# Patient Record
Sex: Female | Born: 1965 | Race: White | Hispanic: No | Smoking: Current every day smoker
Health system: Southern US, Community
[De-identification: ages and names within clinical notes are randomized; demographics above are authoritative.]

## PROBLEM LIST (undated history)

## (undated) DIAGNOSIS — E119 Type 2 diabetes mellitus without complications: Secondary | ICD-10-CM

## (undated) DIAGNOSIS — G43909 Migraine, unspecified, not intractable, without status migrainosus: Secondary | ICD-10-CM

## (undated) DIAGNOSIS — E785 Hyperlipidemia, unspecified: Secondary | ICD-10-CM

## (undated) DIAGNOSIS — I10 Essential (primary) hypertension: Secondary | ICD-10-CM

## (undated) DIAGNOSIS — J189 Pneumonia, unspecified organism: Secondary | ICD-10-CM

## (undated) HISTORY — PX: TUBAL LIGATION: SHX77

## (undated) HISTORY — PX: ABLATION: SHX5711

## (undated) HISTORY — DX: Hyperlipidemia, unspecified: E78.5

## (undated) HISTORY — DX: Migraine, unspecified, not intractable, without status migrainosus: G43.909

## (undated) HISTORY — DX: Type 2 diabetes mellitus without complications: E11.9

## (undated) HISTORY — DX: Essential (primary) hypertension: I10

---

## 2005-02-18 ENCOUNTER — Encounter: Admission: RE | Admit: 2005-02-18 | Discharge: 2005-02-18 | Payer: Self-pay | Admitting: *Deleted

## 2006-07-28 ENCOUNTER — Other Ambulatory Visit: Admission: RE | Admit: 2006-07-28 | Discharge: 2006-07-28 | Payer: Self-pay | Admitting: Family Medicine

## 2007-01-18 ENCOUNTER — Encounter: Admission: RE | Admit: 2007-01-18 | Discharge: 2007-01-18 | Payer: Self-pay | Admitting: Gastroenterology

## 2007-03-07 ENCOUNTER — Ambulatory Visit (HOSPITAL_COMMUNITY): Admission: RE | Admit: 2007-03-07 | Discharge: 2007-03-07 | Payer: Self-pay | Admitting: Obstetrics and Gynecology

## 2007-09-07 ENCOUNTER — Other Ambulatory Visit: Admission: RE | Admit: 2007-09-07 | Discharge: 2007-09-07 | Payer: Self-pay | Admitting: Obstetrics and Gynecology

## 2007-10-11 ENCOUNTER — Encounter: Admission: RE | Admit: 2007-10-11 | Discharge: 2007-10-11 | Payer: Self-pay | Admitting: Family Medicine

## 2008-04-23 ENCOUNTER — Encounter: Admission: RE | Admit: 2008-04-23 | Discharge: 2008-04-23 | Payer: Self-pay | Admitting: Family Medicine

## 2008-08-17 ENCOUNTER — Emergency Department (HOSPITAL_COMMUNITY): Admission: EM | Admit: 2008-08-17 | Discharge: 2008-08-17 | Payer: Self-pay | Admitting: Emergency Medicine

## 2009-11-13 ENCOUNTER — Other Ambulatory Visit: Admission: RE | Admit: 2009-11-13 | Discharge: 2009-11-13 | Payer: Self-pay | Admitting: Family Medicine

## 2010-04-25 ENCOUNTER — Emergency Department (HOSPITAL_COMMUNITY)
Admission: EM | Admit: 2010-04-25 | Discharge: 2010-04-25 | Payer: Self-pay | Source: Home / Self Care | Admitting: Emergency Medicine

## 2010-07-04 ENCOUNTER — Encounter: Payer: Self-pay | Admitting: Family Medicine

## 2010-08-24 LAB — BASIC METABOLIC PANEL
CO2: 27 mEq/L (ref 19–32)
Calcium: 9.1 mg/dL (ref 8.4–10.5)
Creatinine, Ser: 0.76 mg/dL (ref 0.4–1.2)
GFR calc Af Amer: >60 mL/min (ref >60)
GFR calc non Af Amer: >60 mL/min (ref >60)
Sodium: 142 mEq/L (ref 135–145)

## 2010-08-24 LAB — DIFFERENTIAL
Basophils Relative: 1 % (ref 0–1)
Eosinophils Absolute: 0.1 10*3/uL (ref 0.0–0.7)
Lymphs Abs: 1.6 10*3/uL (ref 0.7–4.0)
Neutro Abs: 5.8 10*3/uL (ref 1.7–7.7)
Neutrophils Relative %: 74 % (ref 43–77)

## 2010-08-24 LAB — CBC
MCH: 32.8 pg (ref 26.0–34.0)
Platelets: 294 10*3/uL (ref 150–400)
RBC: 4.35 MIL/uL (ref 3.87–5.11)

## 2010-08-24 LAB — URINALYSIS, ROUTINE W REFLEX MICROSCOPIC
Hgb urine dipstick: NEGATIVE
Nitrite: NEGATIVE
Protein, ur: NEGATIVE mg/dL
Specific Gravity, Urine: 1.011 (ref 1.005–1.030)
Urobilinogen, UA: 0.2 mg/dL (ref 0.0–1.0)

## 2010-08-24 LAB — D-DIMER, QUANTITATIVE: D-Dimer, Quant: <0.22 ug/mL-FEU (ref 0.00–0.48)

## 2010-08-24 LAB — POCT PREGNANCY, URINE: Preg Test, Ur: NEGATIVE

## 2010-08-24 LAB — POCT CARDIAC MARKERS

## 2010-09-23 ENCOUNTER — Other Ambulatory Visit: Payer: Self-pay | Admitting: Family Medicine

## 2010-09-23 DIAGNOSIS — R921 Mammographic calcification found on diagnostic imaging of breast: Secondary | ICD-10-CM

## 2010-09-23 LAB — URINALYSIS, ROUTINE W REFLEX MICROSCOPIC
Bilirubin Urine: NEGATIVE
Hgb urine dipstick: NEGATIVE
Specific Gravity, Urine: 1.025 (ref 1.005–1.030)
pH: 6 (ref 5.0–8.0)

## 2010-09-23 LAB — DIFFERENTIAL
Basophils Absolute: 0 10*3/uL (ref 0.0–0.1)
Eosinophils Relative: 1 % (ref 0–5)
Lymphocytes Relative: 22 % (ref 12–46)
Neutro Abs: 3.2 10*3/uL (ref 1.7–7.7)

## 2010-09-23 LAB — COMPREHENSIVE METABOLIC PANEL
AST: 21 U/L (ref 0–37)
BUN: 4 mg/dL — ABNORMAL LOW (ref 6–23)
CO2: 22 mEq/L (ref 19–32)
Chloride: 109 mEq/L (ref 96–112)
Creatinine, Ser: 0.74 mg/dL (ref 0.4–1.2)
GFR calc non Af Amer: >60 mL/min (ref >60)
Glucose, Bld: 140 mg/dL — ABNORMAL HIGH (ref 70–99)
Total Bilirubin: 0.3 mg/dL (ref 0.3–1.2)

## 2010-09-23 LAB — CBC
HCT: 44.3 % (ref 36.0–46.0)
Hemoglobin: 15.4 g/dL — ABNORMAL HIGH (ref 12.0–15.0)
MCV: 92.5 fL (ref 78.0–100.0)
RBC: 4.8 MIL/uL (ref 3.87–5.11)
WBC: 4.6 10*3/uL (ref 4.0–10.5)

## 2010-09-23 LAB — LIPASE, BLOOD: Lipase: 27 U/L (ref 11–59)

## 2010-09-28 ENCOUNTER — Ambulatory Visit
Admission: RE | Admit: 2010-09-28 | Discharge: 2010-09-28 | Disposition: A | Discharge status: Home / Self Care | Payer: BC Managed Care – PPO | Source: Ambulatory Visit | Attending: Family Medicine | Admitting: Family Medicine

## 2010-09-28 DIAGNOSIS — R921 Mammographic calcification found on diagnostic imaging of breast: Secondary | ICD-10-CM

## 2010-10-26 NOTE — H&P (Signed)
NAMEJANISE, [name] NO.:  [id number]   MEDICAL RECORD NO.:  [id number]          PATIENT TYPE:  AMB   LOCATION:  SDC                           FACILITY:  [facility]   PHYSICIAN:  Gerald Leitz, MD          DATE OF BIRTH:  [birth year]   DATE OF ADMISSION:  03/07/2007  DATE OF DISCHARGE:                              HISTORY & PHYSICAL   HISTORY OF PRESENT ILLNESS:  This is a 45 year old, G3, P3, with  menorrhagia who desires therapy with endometrial ablation.  She also  desires permanent sterilization.   PAST MEDICAL HISTORY:  1. Depression.  2. Questionable mild Crohn's disease.   PAST OB HISTORY:  Spontaneous vaginal delivery times 3.   PAST GYN HISTORY:  1. 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  2. Endometrial biopsy performed January 17, 2007.  She had proliferative      endometrium.  No evidence of hyperplasia or malignancy.   PAST SURGICAL HISTORY:  1. Laparoscopy in 1996 for pelvic pain and dyspareunia which was done      in [place], [place].  2. Tonsillectomy and adenoidectomy in 1985.   MEDICATIONS:  Effexor 75 mg XR 1 p.o. daily.   ALLERGIES:  No known drug allergies.   SOCIAL HISTORY:  The patient is married.  She quit smoking 8-1/2 years  ago.  She denies alcohol use and no illicit drug use.   FAMILY HISTORY:  Maternal grandmother with breast cancer.  Maternal aunt  with ovarian cancer.  No history of colon cancer.   REVIEW OF SYSTEMS:  Negative except as stated in the history of present  illness.   PHYSICAL EXAMINATION:  VITAL SIGNS:  Blood pressure 108/72, weight 195  pounds.  CARDIOVASCULAR:  Regular rate and rhythm.  LUNGS:  Clear to auscultation bilaterally.  ABDOMEN:  Soft, nontender, nondistended, positive bowel sounds.  EXTREMITIES:  No clubbing, cyanosis or edema.  PELVIC:  Normal external female genitalia, no vaginal or cervical  lesions noted.  Normal sized uterus.  No adnexal masses or tenderness.  Ultrasound performed January 15, 2007 shows the uterus to measure 9.8 cm  in length, 4.8 cm in AP diameter and 5.9 cm in width.  Right and left  ovaries appeared normal.  The left ovary had a simple cyst 3.7 x 3.4 cm,  which was avascular.   IMPRESSION:  1. Menorrhagia.  2. Desires permanent sterilization.   PLAN:  The patient desires laparoscopic bilateral tubal ligation and  hysteroscopy and endometrial ablation.  Risks, benefits, and  alternatives of surgery were discussed with the patient including but  not limited to infection, bleeding, damage to bowel, bladder or  surrounding organs with need for further surgery, as well as the risk of  uterine perforation with need for further surgery.  The patient voiced  understanding of risks.  Discussed with the patient risk of transfusion,  HIV, hepatitis B and C.  The patient understands risks and desires to  proceed with laparoscopic bilateral tubal ligation and hysteroscopy with  endometrial ablation.  Gerald Leitz, MD  Electronically Signed     TC/MEDQ  D:  03/05/2007  T:  03/06/2007  Job:  [id number]

## 2010-10-26 NOTE — Op Note (Signed)
[name], [name] NO.:  [id number]   MEDICAL RECORD NO.:  [id number]          PATIENT TYPE:  AMB   LOCATION:  SDC                           [facility]ACILITY:  [facility]   PHYSICIAN:  Gerald Leitz, MD          DATE OF BIRTH:  04-22-1966   DATE OF PROCEDURE:  03/07/2007  DATE OF DISCHARGE:                               OPERATIVE REPORT   PREOPERATIVE DIAGNOSIS:  1. Menorrhagia.  2. Desires permanent sterilization.   POSTOPERATIVE DIAGNOSIS:  1. Menorrhagia.  2. Desires permanent sterilization.   PROCEDURE:  Diagnostic hysteroscopy with NovaSure endometrial ablation  and laparoscopic bilateral tubal ligation with Filshie clip   SURGEON:  Gerald Leitz, M.D.   ASSISTANT:  None.   ANESTHESIA:  General.   FINDINGS:  Normal appearing endometrium.  Normal uterus, fallopian tubes  and ovaries.   SPECIMEN:  None.   ESTIMATED BLOOD LOSS:  Minimal.   FLUIDS:  1600 mL of LR.   URINE OUTPUT:  300 mL of clear urine at the end of the procedure.   COMPLICATIONS:  None.   INDICATIONS:  This is a 45 year old with menorrhagia who failed medical  management and desired therapy with endometrial ablation.  She also  desires permanent sterilization.   PROCEDURE IN DETAIL:  Informed consent was obtained. The patient was  taken to the operating room where she was placed under general  anesthesia. She was placed in the dorsal lithotomy position.  She was  then prepped and draped in the usual sterile fashion.  A bivalve  speculum was placed into the vaginal vault and the anterior lip of the  cervix grasped with a single tooth tenaculum.  The uterus was sounded to  8.5 cm.  The endocervical length was found to be 2.5 cm. The  hysteroscope was inserted and the cavity was found to be normal.  The  hysteroscope was removed and the cervix dilated to 8 mm.  The NovaSure  ablation apparatus was inserted until the fundus was reached.  The array  was deployed. CO2 cavity integrity test was  performed and found to be  normal. Ablation proceeded for a total of 75 seconds. The NovaSure  apparatus was removed.  The hysteroscope was then reinserted.  No  evidence of perforation. Endometrium seemed to be appropriately  desiccated.  The single tooth tenaculum was removed from the cervix and  the uterine manipulator was then placed. Sterile gloves were changed.   Attention was turned to the abdomen where a 5 mm skin incision was made  at the umbilicus and the 5 mm Excel trocar was placed under direct  visualization. Pneumoperitoneum was achieved with CO2 gas.  Opening  pressure was 8 mm. Once pneumoperitoneum was achieved, the pelvis was  examined with the findings noted above. A 1 cm incision was made 2 cm  above the pubic symphysis and a 10 mm trocar was placed under direct  visualization.  Filshie clips were then placed on the left and right  fallopian tubes.  The clip apparatus was removed.  CO2 gas was released.  The 10 mm trocar  was removed followed by the 5 mm trocar under direct  visualization.  The fascia at the 10 mm trocar site was reapproximated  with 0 Vicryl.  The skin was closed with 3-0 Vicryl.  The skin of the  umbilicus was closed with 4-0 Vicryl.  The uterine manipulator was  removed from the cervix.  There was found to be some bleeding at the  manipulator site and hemostasis was obtained with silver nitrate.  The  patient was then awakened from anesthesia.  She was taken to the  recovery room awake and in stable condition.      Gerald Leitz, MD  Electronically Signed     TC/MEDQ  D:  03/07/2007  T:  03/07/2007  Job:  [id number]

## 2010-12-03 ENCOUNTER — Other Ambulatory Visit: Payer: Self-pay | Admitting: Family Medicine

## 2010-12-03 ENCOUNTER — Ambulatory Visit
Admission: RE | Admit: 2010-12-03 | Discharge: 2010-12-03 | Disposition: A | Discharge status: Home / Self Care | Payer: BC Managed Care – PPO | Source: Ambulatory Visit | Attending: Family Medicine | Admitting: Family Medicine

## 2010-12-03 ENCOUNTER — Other Ambulatory Visit (HOSPITAL_COMMUNITY)
Admission: RE | Admit: 2010-12-03 | Discharge: 2010-12-03 | Disposition: A | Discharge status: Home / Self Care | Payer: BC Managed Care – PPO | Source: Ambulatory Visit | Attending: Family Medicine | Admitting: Family Medicine

## 2010-12-03 DIAGNOSIS — R062 Wheezing: Secondary | ICD-10-CM

## 2010-12-03 DIAGNOSIS — R05 Cough: Secondary | ICD-10-CM

## 2010-12-03 DIAGNOSIS — Z124 Encounter for screening for malignant neoplasm of cervix: Secondary | ICD-10-CM | POA: Insufficient documentation

## 2011-03-24 LAB — CBC
Hemoglobin: 14
MCHC: 34.6
MCV: 89.7
RBC: 4.52
WBC: 5.4

## 2011-03-24 LAB — TYPE AND SCREEN
ABO/RH(D): O POS
Antibody Screen: NEGATIVE

## 2011-03-24 LAB — BASIC METABOLIC PANEL
CO2: 28
Chloride: 105
Creatinine, Ser: 0.66
GFR calc Af Amer: >60
Sodium: 137

## 2011-03-24 LAB — PREGNANCY, URINE: Preg Test, Ur: NEGATIVE

## 2011-03-24 LAB — ABO/RH: ABO/RH(D): O POS

## 2011-12-19 ENCOUNTER — Emergency Department (HOSPITAL_COMMUNITY)
Admission: EM | Admit: 2011-12-19 | Discharge: 2011-12-19 | Disposition: A | Discharge status: Home / Self Care | Payer: 59 | Attending: Emergency Medicine | Admitting: Emergency Medicine

## 2011-12-19 ENCOUNTER — Encounter (HOSPITAL_COMMUNITY): Payer: Self-pay | Admitting: Emergency Medicine

## 2011-12-19 ENCOUNTER — Emergency Department (HOSPITAL_COMMUNITY): Payer: 59

## 2011-12-19 DIAGNOSIS — R079 Chest pain, unspecified: Secondary | ICD-10-CM

## 2011-12-19 DIAGNOSIS — R0602 Shortness of breath: Secondary | ICD-10-CM | POA: Insufficient documentation

## 2011-12-19 DIAGNOSIS — F17200 Nicotine dependence, unspecified, uncomplicated: Secondary | ICD-10-CM | POA: Insufficient documentation

## 2011-12-19 LAB — BASIC METABOLIC PANEL
Chloride: 102 mEq/L (ref 96–112)
GFR calc Af Amer: >90 mL/min (ref >90)
GFR calc non Af Amer: >90 mL/min (ref >90)
Potassium: 4.2 mEq/L (ref 3.5–5.1)
Sodium: 137 mEq/L (ref 135–145)

## 2011-12-19 LAB — TROPONIN I: Troponin I: <0.3 ng/mL (ref <0.30)

## 2011-12-19 LAB — CBC
HCT: 42.1 % (ref 36.0–46.0)
MCHC: 34.9 g/dL (ref 30.0–36.0)
RDW: 12.7 % (ref 11.5–15.5)
WBC: 9 10*3/uL (ref 4.0–10.5)

## 2011-12-19 LAB — D-DIMER, QUANTITATIVE: D-Dimer, Quant: <0.22 ug/mL-FEU (ref 0.00–0.48)

## 2011-12-19 MED ORDER — IBUPROFEN 200 MG PO TABS
600.0000 mg | ORAL_TABLET | Freq: Once | ORAL | Status: AC
  Administered 2011-12-19: 600 mg via ORAL
  Filled 2011-12-19: qty 3

## 2011-12-19 MED ORDER — OMEPRAZOLE 20 MG PO CPDR: 20.0000 mg | DELAYED_RELEASE_CAPSULE | Freq: Every day | ORAL | Status: DC

## 2011-12-19 MED ORDER — GI COCKTAIL ~~LOC~~
30.0000 mL | Freq: Once | ORAL | Status: AC
  Administered 2011-12-19: 30 mL via ORAL
  Filled 2011-12-19: qty 30

## 2011-12-19 MED ORDER — IBUPROFEN 600 MG PO TABS: 600.0000 mg | ORAL_TABLET | Freq: Three times a day (TID) | ORAL | Status: AC | PRN

## 2011-12-19 NOTE — ED Notes (Signed)
Pt states that she began having chest pain, dizziness, tingling, diaphoresis, and SOB. Laid down for awhile and pain resolved. Then got back up and pain started again. Laid down again and pain improved again. Pt has equal, unlabored respirations and is in nad.

## 2011-12-19 NOTE — ED Notes (Signed)
Pt alert, nad, c/o sharp non radiating left sided chest pain, onset was this evening, pt c/o sob, resp even unlabored, skin pwd, states pain comes and goes, denies n/v

## 2011-12-19 NOTE — ED Provider Notes (Signed)
History     CSN: [id number]  Arrival date & time 12/19/11  0358   First MD Initiated Contact with Patient 12/19/11 [phone]      Chief Complaint  Patient presents with  . Chest Pain     The history is provided by the patient.   the patient reports developing sharp left-sided chest pain this evening.  She had mild shortness of breath.  She reported the pain would come for several minutes and then would resolve.  There is no radiation of her pain.  She had no diaphoresis.  She denies nausea and vomiting.  She does smoke cigarettes but otherwise has no cardiac risk factors.  She is no prior history of DVT or pulmonary embolism.  She denies fevers and chills.  She's had no productive cough.  She denies recent lung travel or surgery.  She's had no unilateral leg swelling.  She denies abdominal pain.  She has no other complaints at this time.  Nothing worsens her symptoms except for deep breathing.  Nothing improves her symptoms.  Her symptoms are mild to moderate in severity  Past Medical History  Diagnosis Date  . Crohn disease     Past Surgical History  Procedure Date  . Tubal ligation     No family history on file.  History  Substance Use Topics  . Smoking status: Current Everyday Smoker -- 1.0 packs/day    Types: Cigarettes  . Smokeless tobacco: Not on file  . Alcohol Use: No    OB History    Grav Para Term Preterm Abortions TAB SAB Ect Mult Living                  Review of Systems  All other systems reviewed and are negative.    Allergies  Klonopin  Home Medications   Current Outpatient Rx  Name Route Sig Dispense Refill  . ALIGN 4 MG PO CAPS Oral Take 1 tablet by mouth daily.    . TOPIRAMATE 25 MG PO CPSP Oral Take 75 mg by mouth daily.    . VENLAFAXINE HCL ER 150 MG PO CP24 Oral Take 150 mg by mouth daily.    . IBUPROFEN 600 MG PO TABS Oral Take 1 tablet (600 mg total) by mouth every 8 (eight) hours as needed for pain. 15 tablet 0  . OMEPRAZOLE 20 MG PO CPDR  Oral Take 1 capsule (20 mg total) by mouth daily. 30 capsule 0    BP 113/77  Pulse 68  Temp 98 F (36.7 C) (Oral)  Resp 21  Wt 185 lb (83.915 kg)  SpO2 97%  Physical Exam  Nursing note and vitals reviewed. Constitutional: She is oriented to person, place, and time. She appears well-developed and well-nourished. No distress.  HENT:  Head: Normocephalic and atraumatic.  Eyes: EOM are normal.  Neck: Normal range of motion.  Cardiovascular: Normal rate, regular rhythm and normal heart sounds.   Pulmonary/Chest: Effort normal and breath sounds normal.       Mild tenderness of left anterior chest wall  Abdominal: Soft. She exhibits no distension. There is no tenderness.  Musculoskeletal: Normal range of motion.  Neurological: She is alert and oriented to person, place, and time.  Skin: Skin is warm and dry.  Psychiatric: She has a normal mood and affect. Judgment normal.    ED Course  Procedures (including critical care time)  Date: 12/19/2011  Rate: 94  Rhythm: normal sinus rhythm  QRS Axis: normal  Intervals: normal  ST/T Wave abnormalities: normal  Conduction Disutrbances: none  Narrative Interpretation:   Old EKG Reviewed: No significant changes noted     Labs Reviewed  BASIC METABOLIC PANEL - Abnormal; Notable for the following:    Glucose, Bld 108 (*)     All other components within normal limits  CBC  TROPONIN I  D-DIMER, QUANTITATIVE   Dg Chest 2 View  12/19/2011  *RADIOLOGY REPORT*  Clinical Data: Left-sided chest pain and shortness of breath; history of smoking.  CHEST - 2 VIEW  Comparison: Chest radiograph performed 12/03/2010  Findings: The lungs are well-aerated.  Slight medial right basilar opacity may reflect atelectasis or possibly minimal pneumonia. There is no evidence of pleural effusion or pneumothorax.  The heart is normal in size; the mediastinal contour is within normal limits.  No acute osseous abnormalities are seen.  IMPRESSION: Slight medial  right basilar airspace opacity may reflect atelectasis or possibly minimal pneumonia.  Original Report Authenticated By: Tonia Ghent, M.D.    I personally reviewed the imaging tests through PACS system  I reviewed available ER/hospitalization records thought the EMR   1. Chest pain       MDM    The patient is well-appearing.  I suspect the majority of her pain is pleurisy.  I do not believe that she has right-sided pneumonia.  Her symptoms are left-sided.  Ibuprofen has helped her pain.  Her EKG is normal her enzymes are normal.  Her d-dimer is normal.  Discharge home in good condition.      Lyanne Co, MD 12/19/11 [phone]

## 2013-04-01 ENCOUNTER — Emergency Department (HOSPITAL_COMMUNITY)
Admission: EM | Admit: 2013-04-01 | Discharge: 2013-04-01 | Disposition: A | Discharge status: Home / Self Care | Payer: PRIVATE HEALTH INSURANCE | Attending: Emergency Medicine | Admitting: Emergency Medicine

## 2013-04-01 ENCOUNTER — Encounter (HOSPITAL_COMMUNITY): Payer: Self-pay | Admitting: Emergency Medicine

## 2013-04-01 DIAGNOSIS — F17200 Nicotine dependence, unspecified, uncomplicated: Secondary | ICD-10-CM | POA: Insufficient documentation

## 2013-04-01 DIAGNOSIS — Z79899 Other long term (current) drug therapy: Secondary | ICD-10-CM | POA: Insufficient documentation

## 2013-04-01 DIAGNOSIS — R319 Hematuria, unspecified: Secondary | ICD-10-CM | POA: Insufficient documentation

## 2013-04-01 DIAGNOSIS — Z792 Long term (current) use of antibiotics: Secondary | ICD-10-CM | POA: Insufficient documentation

## 2013-04-01 DIAGNOSIS — N390 Urinary tract infection, site not specified: Secondary | ICD-10-CM | POA: Insufficient documentation

## 2013-04-01 LAB — URINALYSIS, ROUTINE W REFLEX MICROSCOPIC
Ketones, ur: NEGATIVE mg/dL
Nitrite: POSITIVE — AB
Protein, ur: >300 mg/dL — AB
Urobilinogen, UA: 1 mg/dL (ref 0.0–1.0)

## 2013-04-01 LAB — URINE MICROSCOPIC-ADD ON

## 2013-04-01 MED ORDER — SULFAMETHOXAZOLE-TMP DS 800-160 MG PO TABS: 1.0000 | ORAL_TABLET | Freq: Two times a day (BID) | ORAL | Status: DC

## 2013-04-01 MED ORDER — PHENAZOPYRIDINE HCL 200 MG PO TABS: 200.0000 mg | ORAL_TABLET | Freq: Three times a day (TID) | ORAL | Status: DC

## 2013-04-01 MED ORDER — SULFAMETHOXAZOLE-TMP DS 800-160 MG PO TABS
1.0000 | ORAL_TABLET | Freq: Once | ORAL | Status: AC
  Administered 2013-04-01: 1 via ORAL
  Filled 2013-04-01: qty 1

## 2013-04-01 MED ORDER — PHENAZOPYRIDINE HCL 200 MG PO TABS
200.0000 mg | ORAL_TABLET | Freq: Three times a day (TID) | ORAL | Status: DC
  Administered 2013-04-01: 200 mg via ORAL
  Filled 2013-04-01 (×2): qty 1

## 2013-04-01 NOTE — ED Provider Notes (Signed)
Medical screening examination/treatment/procedure(s) were performed by non-physician practitioner and as supervising physician I was immediately available for consultation/collaboration.   Kura Bethards, MD 04/01/13 0608 

## 2013-04-01 NOTE — ED Notes (Signed)
Urinary frequency with some blood noted

## 2013-04-01 NOTE — ED Provider Notes (Signed)
CSN: [id number]     Arrival date & time 04/01/13  0154 History   First MD Initiated Contact with Patient 04/01/13 0208     Chief Complaint  Patient presents with  . Urinary Frequency   (Consider location/radiation/quality/duration/timing/severity/associated sxs/prior Treatment) Patient is a 47 y.o. female presenting with frequency. The history is provided by the patient. No language interpreter was used.  Urinary Frequency This is a new problem. The current episode started yesterday. The problem occurs constantly. Pertinent negatives include no abdominal pain, fever or vomiting. Associated symptoms comments: Symptoms of urinary frequency, painful urination, hematuria. No fever, nausea or vomiting. "It feels like I have a urinary tract infection". No vaginal discharge. Marland Kitchen    History reviewed. No pertinent past medical history. Past Surgical History  Procedure Laterality Date  . Tubal ligation    . Ablation     No family history on file. History  Substance Use Topics  . Smoking status: Current Every Day Smoker -- 1.00 packs/day    Types: Cigarettes  . Smokeless tobacco: Not on file  . Alcohol Use: Yes     Comment: socially   OB History   Grav Para Term Preterm Abortions TAB SAB Ect Mult Living                 Review of Systems  Constitutional: Negative for fever.  Gastrointestinal: Negative for vomiting and abdominal pain.  Genitourinary: Positive for urgency, frequency and hematuria. Negative for flank pain and vaginal discharge.    Allergies  Klonopin and Shellfish allergy  Home Medications   Current Outpatient Rx  Name  Route  Sig  Dispense  Refill  . naproxen sodium (ANAPROX) 220 MG tablet   Oral   Take 220 mg by mouth 2 (two) times daily as needed (pain).         . topiramate (TOPAMAX) 25 MG capsule   Oral   Take 75 mg by mouth every evening.          . venlafaxine XR (EFFEXOR-XR) 150 MG 24 hr capsule   Oral   Take 150 mg by mouth every morning.            BP 164/90  Pulse 97  Temp(Src) 98.7 F (37.1 C) (Oral)  Resp 16  Ht 5\' 3"  (1.6 m)  Wt 147 lb (66.679 kg)  BMI 26.05 kg/m2  SpO2 97% Physical Exam  Constitutional: She is oriented to person, place, and time. She appears well-developed and well-nourished.  Abdominal: Soft. She exhibits no distension.  Mild suprapubic tenderness.   Neurological: She is alert and oriented to person, place, and time.  Skin: Skin is warm and dry.  Psychiatric: She has a normal mood and affect.    ED Course  Procedures (including critical care time) Labs Review Labs Reviewed  URINALYSIS, ROUTINE W REFLEX MICROSCOPIC - Abnormal; Notable for the following:    APPearance TURBID (*)    Specific Gravity, Urine 1.031 (*)    Hgb urine dipstick LARGE (*)    Bilirubin Urine SMALL (*)    Protein, ur >300 (*)    Nitrite POSITIVE (*)    Leukocytes, UA LARGE (*)    All other components within normal limits  URINE MICROSCOPIC-ADD ON - Abnormal; Notable for the following:    Squamous Epithelial / LPF FEW (*)    Bacteria, UA MANY (*)    All other components within normal limits  URINE CULTURE   Imaging Review No results found.  EKG Interpretation  None       MDM  No diagnosis found. 1. UTI  Uncomplicated UTI. Abx and pyridium given. Stable for discharge.      Arnoldo Hooker, PA-C 04/01/13 [phone]

## 2013-04-02 LAB — URINE CULTURE: Colony Count: 70000

## 2013-06-29 ENCOUNTER — Emergency Department (HOSPITAL_COMMUNITY)
Admission: EM | Admit: 2013-06-29 | Discharge: 2013-06-29 | Disposition: A | Discharge status: Home / Self Care | Payer: Self-pay | Attending: Emergency Medicine | Admitting: Emergency Medicine

## 2013-06-29 ENCOUNTER — Emergency Department (HOSPITAL_COMMUNITY): Payer: Self-pay

## 2013-06-29 ENCOUNTER — Encounter (HOSPITAL_COMMUNITY): Payer: Self-pay | Admitting: Emergency Medicine

## 2013-06-29 DIAGNOSIS — R6889 Other general symptoms and signs: Secondary | ICD-10-CM

## 2013-06-29 DIAGNOSIS — J189 Pneumonia, unspecified organism: Secondary | ICD-10-CM | POA: Insufficient documentation

## 2013-06-29 DIAGNOSIS — M545 Low back pain, unspecified: Secondary | ICD-10-CM | POA: Insufficient documentation

## 2013-06-29 DIAGNOSIS — F17200 Nicotine dependence, unspecified, uncomplicated: Secondary | ICD-10-CM | POA: Insufficient documentation

## 2013-06-29 DIAGNOSIS — J441 Chronic obstructive pulmonary disease with (acute) exacerbation: Secondary | ICD-10-CM | POA: Insufficient documentation

## 2013-06-29 DIAGNOSIS — J111 Influenza due to unidentified influenza virus with other respiratory manifestations: Secondary | ICD-10-CM | POA: Insufficient documentation

## 2013-06-29 DIAGNOSIS — Z8701 Personal history of pneumonia (recurrent): Secondary | ICD-10-CM | POA: Insufficient documentation

## 2013-06-29 DIAGNOSIS — Z79899 Other long term (current) drug therapy: Secondary | ICD-10-CM | POA: Insufficient documentation

## 2013-06-29 DIAGNOSIS — J449 Chronic obstructive pulmonary disease, unspecified: Secondary | ICD-10-CM

## 2013-06-29 HISTORY — DX: Pneumonia, unspecified organism: J18.9

## 2013-06-29 MED ORDER — HYDROCODONE-ACETAMINOPHEN 7.5-325 MG/15ML PO SOLN: 15.0000 mL | Freq: Four times a day (QID) | ORAL | Status: AC | PRN

## 2013-06-29 NOTE — ED Provider Notes (Signed)
CSN: [id number]     Arrival date & time 06/29/13  1324 History  This chart was scribed for non-physician practitioner, Fayrene Helper, PA-C working with Audree Camel, MD by Greggory Stallion, ED scribe. This patient was seen in room WTR5/WTR5 and the patient's care was started at 3:22 PM.   Chief Complaint  Patient presents with  . Shortness of Breath   Patient is a 48 y.o. female presenting with shortness of breath. The history is provided by the patient. No language interpreter was used.  Shortness of Breath Severity:  Moderate Onset quality:  Gradual Duration:  3 weeks Timing:  Intermittent Progression:  Unchanged Chronicity:  New Ineffective treatments:  Inhaler (antibiotics, cough medication) Associated symptoms: cough and wheezing   Associated symptoms: no chest pain    HPI Comments: [name] is a 48 y.o. female who presents to the Emergency Department complaining of intermittent shortness of breath with associated cough and wheezing that started 3-4 weeks ago. She states she had dizziness this morning but denies it currently. Pt states she was recently diagnosed with bilateral pneumonia by her PCP. A chest xray was not done. She states she has taken two Z-packs, cough medication and an inhaler with no relief. Pt states she has started having lower back pain that feels like a bruise. She has had intermittent chest pain but denies it currently. Pt smokes one pack of cigarettes per day. Denies history of heart problems or asthma. She states her PCP discussed the possibility of COPD with her but she has never been diagnosed.   Past Medical History  Diagnosis Date  . Pneumonia    Past Surgical History  Procedure Laterality Date  . Tubal ligation    . Ablation     History reviewed. No pertinent family history. History  Substance Use Topics  . Smoking status: Current Every Day Smoker -- 1.00 packs/day    Types: Cigarettes  . Smokeless tobacco: Not on file  . Alcohol Use: Yes      Comment: socially   OB History   Grav Para Term Preterm Abortions TAB SAB Ect Mult Living                 Review of Systems  Respiratory: Positive for cough, shortness of breath and wheezing.   Cardiovascular: Negative for chest pain.  Musculoskeletal: Positive for back pain.  Neurological: Negative for dizziness.  All other systems reviewed and are negative.   Allergies  Klonopin and Shellfish allergy  Home Medications   Current Outpatient Rx  Name  Route  Sig  Dispense  Refill  . naproxen sodium (ANAPROX) 220 MG tablet   Oral   Take 220 mg by mouth 2 (two) times daily as needed (pain).         . phenazopyridine (PYRIDIUM) 200 MG tablet   Oral   Take 1 tablet (200 mg total) by mouth 3 (three) times daily with meals.   10 tablet   0   . sulfamethoxazole-trimethoprim (BACTRIM DS) 800-160 MG per tablet   Oral   Take 1 tablet by mouth 2 (two) times daily.   10 tablet   0   . topiramate (TOPAMAX) 25 MG capsule   Oral   Take 75 mg by mouth every evening.          . venlafaxine XR (EFFEXOR-XR) 150 MG 24 hr capsule   Oral   Take 150 mg by mouth every morning.  BP 140/84  Pulse 79  Temp(Src) 98.4 F (36.9 C) (Oral)  Resp 18  SpO2 99%  Physical Exam  Nursing note and vitals reviewed. Constitutional: She is oriented to person, place, and time. She appears well-developed and well-nourished. No distress.  HENT:  Head: Normocephalic and atraumatic.  Eyes: EOM are normal.  Neck: Neck supple. No tracheal deviation present.  Cardiovascular: Normal rate, regular rhythm and normal heart sounds.   Pulmonary/Chest: Effort normal and breath sounds normal. No respiratory distress. She has no wheezes. She has no rales.  Musculoskeletal: Normal range of motion.  No overlying skin changes.   Neurological: She is alert and oriented to person, place, and time.  Skin: Skin is warm and dry.  Psychiatric: She has a normal mood and affect. Her behavior is  normal.   ED Course  Procedures (including critical care time)  DIAGNOSTIC STUDIES: Oxygen Saturation is 99% on RA, normal by my interpretation.    COORDINATION OF CARE: 3:38 PM-Discussed xray results and treatment plan which includes symptomatic treatment for a viral infection with pt at bedside and pt agreed to plan. Advised pt to quit smoking. Advised pt that  an antibiotic would not be useful with her viral infection. Advised pt to follow up with her PCP or return to the ED if her symptoms worsen.   Moderate time were spent discussing smoking cessation.    Labs Review Labs Reviewed - No data to display Imaging Review Dg Chest 2 View  06/29/2013   CLINICAL DATA:  Two week history of flank pain now with dyspnea and chest pain  EXAM: CHEST  2 VIEW  COMPARISON:  PA and lateral chest x-ray of December 29, 2011  FINDINGS: The lungs are mildly hyperinflated. There is no focal infiltrate. The interstitial markings are somewhat coarse diffusely. The cardiopericardial silhouette is normal in size. The pulmonary vascularity is not engorged. The mediastinum is normal in width. There is no pleural effusion. The observed portions of the bony thorax exhibit no acute abnormalities.  IMPRESSION: There is mild hyperinflation which may be voluntary or reflect underlying COPD or reactive airway disease. Slightly increased lung markings diffusely are not new. These may be related to the patient's smoking history. There is no evidence of pneumonia nor of a pleural effusion nor other acute cardiopulmonary abnormality.   Electronically Signed   By: David  [email or web address]   On: 06/29/2013 14:44    EKG Interpretation   None       MDM   1. Flu-like symptoms   2. COPD (chronic obstructive pulmonary disease)    BP 140/84  Pulse 79  Temp(Src) 98.4 F (36.9 C) (Oral)  Resp 18  SpO2 99%  I have reviewed nursing notes and vital signs. I personally reviewed the imaging tests through PACS system  I reviewed available  ER/hospitalization records thought the EMR  I personally performed the services described in this documentation, which was scribed in my presence. The recorded information has been reviewed and is accurate.    Fayrene HelperBowie Jaina Morin, PA-C 06/29/13 1550

## 2013-06-29 NOTE — Discharge Instructions (Signed)
Viral Infections A viral infection can be caused by different types of viruses.Most viral infections are not serious and resolve on their own. However, some infections may cause severe symptoms and may lead to further complications. SYMPTOMS Viruses can frequently cause:  Minor sore throat.  Aches and pains.  Headaches.  Runny nose.  Different types of rashes.  Watery eyes.  Tiredness.  Cough.  Loss of appetite.  Gastrointestinal infections, resulting in nausea, vomiting, and diarrhea. These symptoms do not respond to antibiotics because the infection is not caused by bacteria. However, you might catch a bacterial infection following the viral infection. This is sometimes called a "superinfection." Symptoms of such a bacterial infection may include:  Worsening sore throat with pus and difficulty swallowing.  Swollen neck glands.  Chills and a high or persistent fever.  Severe headache.  Tenderness over the sinuses.  Persistent overall ill feeling (malaise), muscle aches, and tiredness (fatigue).  Persistent cough.  Yellow, green, or brown mucus production with coughing. HOME CARE INSTRUCTIONS   Only take over-the-counter or prescription medicines for pain, discomfort, diarrhea, or fever as directed by your caregiver.  Drink enough water and fluids to keep your urine clear or pale yellow. Sports drinks can provide valuable electrolytes, sugars, and hydration.  Get plenty of rest and maintain proper nutrition. Soups and broths with crackers or rice are fine. SEEK IMMEDIATE MEDICAL CARE IF:   You have severe headaches, shortness of breath, chest pain, neck pain, or an unusual rash.  You have uncontrolled vomiting, diarrhea, or you are unable to keep down fluids.  You or your child has an oral temperature above 102 F (38.9 C), not controlled by medicine.  Your baby is older than 3 months with a rectal temperature of 102 F (38.9 C) or higher.  Your baby is 63  months old or younger with a rectal temperature of 100.4 F (38 C) or higher. MAKE SURE YOU:   Understand these instructions.  Will watch your condition.  Will get help right away if you are not doing well or get worse. Document Released: 03/09/2005 Document Revised: 08/22/2011 Document Reviewed: 10/04/2010 [facility] Patient Information 2014 [place], [place].   Chronic Obstructive Pulmonary Disease Chronic obstructive pulmonary disease (COPD) is a common lung problem. In COPD, the flow of air from the lungs is limited. The way your lungs work will probably never return to normal, but there are things you can do to improve you lungs and make yourself feel better. HOME CARE  Take all medicines as told by your doctor.  Only take over-the-counter or prescription medicines as told by your doctor.  Avoid medicines or cough syrups that dry up your airway (such as antihistamines) and do not allow you to get rid of thick spit. You do not need to avoid them if told differently by your doctor.  If you smoke, stop. Smoking makes the problem worse.  Avoid being around things that make your breathing worse (like smoke, chemicals, and fumes).  Use oxygen therapy and therapy to help improve your lungs (pulmonary rehabilitation) if told by your doctor. If you need home oxygen therapy, ask your doctor if you should buy a tool to measure your oxygen level (oximeter).  Avoid people who have a sickness you can catch (contagious).  Avoid going outside when it is very hot, cold, or humid.  Eat healthy foods. Eat smaller meals more often. Rest before meals.  Stay active, but remember to also rest.  Make sure to get all the  shots (vaccines) your doctor recommends. Ask your doctor if you need a pneumonia shot.  Learn and use tips on how to relax.  Learn and use tips on how to control your breathing as told by your doctor. Try:  Breathing in (inhaling) through your nose for 1 second. Then, pucker your  lips and breath out (exhale) through your lips for 2 seconds.  Putting one hand on your belly (abdomen). Breathe in slowly through your nose for 1 second. Your hand on your belly should move out. Pucker your lips and breathe out slowly through your lips. Your hand on your belly should move in as you breathe out.  Learn and use controlled coughing to clear thick spit from your lungs. 1. Lean your head a little forward. 2. Breathe in deeply. 3. Try to hold your breath for 3 seconds. 4. Keep your mouth slightly open while coughing 2 times. 5. Spit any thick spit out into a tissue. 6. Rest and do the steps again 1 or 2 times as needed. GET HELP IF:  You cough up more thick spit than usual.  There is a change in the color or thickness of the spit.  It is harder to breathe than usual.  Your breathing is faster than usual. GET HELP RIGHT AWAY IF:   You have shortness of breath while resting.  You have shortness of breath that stops you from:  Being able to talk.  Doing normal activities.  You chest hurts for longer than 5 minutes.  Your skin color is more blue than usual.  Your pulse oximeter shows that you have low oxygen for longer than 5 minutes. MAKE SURE YOU:   Understand these instructions.  Will watch your condition.  Will get help right away if you are not doing well or get worse. Document Released: 11/16/2007 Document Revised: 03/20/2013 Document Reviewed: 01/24/2013 [facility] Patient Information 2014 [place], [place].

## 2013-06-29 NOTE — ED Notes (Signed)
She c/o persistent shortness of breath. She has recently been diagnosed with bilat. Pneumonia by her PCP.  She states so far she has taken two Z-packs and is currently on Moxifloxacin.  She is in no distress; and is a bit shaky d/t using albuterol inhaler.

## 2013-06-29 NOTE — ED Provider Notes (Signed)
Medical screening examination/treatment/procedure(s) were performed by non-physician practitioner and as supervising physician I was immediately available for consultation/collaboration.   Loc Feinstein R Leila Schuff, MD 06/29/13 1616 

## 2013-10-23 ENCOUNTER — Other Ambulatory Visit: Payer: Self-pay | Admitting: Family Medicine

## 2013-10-23 ENCOUNTER — Ambulatory Visit
Admission: RE | Admit: 2013-10-23 | Discharge: 2013-10-23 | Disposition: A | Discharge status: Home / Self Care | Payer: PRIVATE HEALTH INSURANCE | Source: Ambulatory Visit | Attending: Family Medicine | Admitting: Family Medicine

## 2013-10-23 DIAGNOSIS — R109 Unspecified abdominal pain: Secondary | ICD-10-CM

## 2013-10-23 DIAGNOSIS — R509 Fever, unspecified: Secondary | ICD-10-CM

## 2013-10-23 MED ORDER — IOHEXOL 300 MG/ML  SOLN
100.0000 mL | Freq: Once | INTRAMUSCULAR | Status: AC | PRN
  Administered 2013-10-23: 100 mL via INTRAVENOUS

## 2014-12-30 ENCOUNTER — Other Ambulatory Visit: Payer: Self-pay

## 2014-12-30 DIAGNOSIS — Z1231 Encounter for screening mammogram for malignant neoplasm of breast: Secondary | ICD-10-CM

## 2015-01-06 ENCOUNTER — Ambulatory Visit: Payer: PRIVATE HEALTH INSURANCE

## 2015-01-09 ENCOUNTER — Ambulatory Visit
Admission: RE | Admit: 2015-01-09 | Discharge: 2015-01-09 | Disposition: A | Discharge status: Home / Self Care | Payer: 59 | Source: Ambulatory Visit

## 2015-01-09 DIAGNOSIS — Z1231 Encounter for screening mammogram for malignant neoplasm of breast: Secondary | ICD-10-CM

## 2015-02-06 ENCOUNTER — Other Ambulatory Visit: Payer: Self-pay | Admitting: Family Medicine

## 2015-02-06 ENCOUNTER — Other Ambulatory Visit (HOSPITAL_COMMUNITY)
Admission: RE | Admit: 2015-02-06 | Discharge: 2015-02-06 | Disposition: A | Discharge status: Home / Self Care | Payer: 59 | Source: Ambulatory Visit | Attending: Family Medicine | Admitting: Family Medicine

## 2015-02-06 DIAGNOSIS — Z1151 Encounter for screening for human papillomavirus (HPV): Secondary | ICD-10-CM | POA: Insufficient documentation

## 2015-02-06 DIAGNOSIS — Z01419 Encounter for gynecological examination (general) (routine) without abnormal findings: Secondary | ICD-10-CM | POA: Insufficient documentation

## 2015-02-11 LAB — CYTOLOGY - PAP

## 2015-09-06 ENCOUNTER — Emergency Department (HOSPITAL_COMMUNITY)
Admission: EM | Admit: 2015-09-06 | Discharge: 2015-09-06 | Disposition: A | Discharge status: Home / Self Care | Payer: 59 | Attending: Emergency Medicine | Admitting: Emergency Medicine

## 2015-09-06 ENCOUNTER — Emergency Department (HOSPITAL_COMMUNITY): Payer: 59

## 2015-09-06 ENCOUNTER — Encounter (HOSPITAL_COMMUNITY): Payer: Self-pay | Admitting: Emergency Medicine

## 2015-09-06 DIAGNOSIS — J069 Acute upper respiratory infection, unspecified: Secondary | ICD-10-CM | POA: Diagnosis not present

## 2015-09-06 DIAGNOSIS — Z8701 Personal history of pneumonia (recurrent): Secondary | ICD-10-CM | POA: Diagnosis not present

## 2015-09-06 DIAGNOSIS — Z792 Long term (current) use of antibiotics: Secondary | ICD-10-CM | POA: Insufficient documentation

## 2015-09-06 DIAGNOSIS — R0981 Nasal congestion: Secondary | ICD-10-CM

## 2015-09-06 DIAGNOSIS — F17210 Nicotine dependence, cigarettes, uncomplicated: Secondary | ICD-10-CM | POA: Diagnosis not present

## 2015-09-06 DIAGNOSIS — R52 Pain, unspecified: Secondary | ICD-10-CM | POA: Diagnosis present

## 2015-09-06 DIAGNOSIS — Z79899 Other long term (current) drug therapy: Secondary | ICD-10-CM | POA: Diagnosis not present

## 2015-09-06 DIAGNOSIS — R42 Dizziness and giddiness: Secondary | ICD-10-CM | POA: Insufficient documentation

## 2015-09-06 DIAGNOSIS — R197 Diarrhea, unspecified: Secondary | ICD-10-CM | POA: Insufficient documentation

## 2015-09-06 MED ORDER — AZITHROMYCIN 250 MG PO TABS
500.0000 mg | ORAL_TABLET | Freq: Once | ORAL | Status: AC
  Administered 2015-09-06: 500 mg via ORAL
  Filled 2015-09-06: qty 2

## 2015-09-06 MED ORDER — AZITHROMYCIN 250 MG PO TABS: 250.0000 mg | ORAL_TABLET | Freq: Every day | ORAL | Status: DC

## 2015-09-06 MED ORDER — ALBUTEROL SULFATE HFA 108 (90 BASE) MCG/ACT IN AERS
2.0000 | INHALATION_SPRAY | RESPIRATORY_TRACT | Status: DC | PRN
  Administered 2015-09-06: 2 via RESPIRATORY_TRACT
  Filled 2015-09-06: qty 6.7

## 2015-09-06 NOTE — ED Provider Notes (Signed)
CSN: [id number]     Arrival date & time 09/06/15  2041 History  By signing my name below, I, Octavia Heir, attest that this documentation has been prepared under the direction and in the presence of Marlon Pel, PA-C. Electronically Signed: Octavia Heir, ED Scribe. 09/06/2015. 10:08 PM.    Chief Complaint  Patient presents with  . URI  . Generalized Body Aches      The history is provided by the patient. No language interpreter was used.   HPI Comments: [name] is a 50 y.o. female who presents to the Emergency Department complaining of a constant, gradual worsening, moderate URI symptoms onset yesterday. She has associated sneezing, sinus pressure, nasal congestion, diarrhea, generalized body aches, chills and subjective fever. Pt also endorses increased head pressure and dizziness. She states she took some zyrtec to alleviate her symptoms with no relief. Pt also mentions sudden onset chest congestion that started with her other symptoms yesterday. She reports falling on concrete last week, landing on her buttocks and she is unsure if that has any correlation with her current symptoms, did not hit head, no loc, no neck injruy. Pt did not receive her flu shot this year.  Thinks she has been around sick contacts.  Past Medical History  Diagnosis Date  . Pneumonia    Past Surgical History  Procedure Laterality Date  . Tubal ligation    . Ablation     History reviewed. No pertinent family history. Social History  Substance Use Topics  . Smoking status: Current Every Day Smoker -- 1.00 packs/day    Types: Cigarettes  . Smokeless tobacco: None  . Alcohol Use: Yes     Comment: socially   OB History    No data available     Review of Systems  Constitutional: Positive for fever and chills.  HENT: Positive for congestion, sinus pressure and sneezing.   Gastrointestinal: Positive for diarrhea.  Neurological: Positive for dizziness and headaches.  All other systems  reviewed and are negative.     Allergies  Klonopin and Shellfish allergy  Home Medications   Prior to Admission medications   Medication Sig Start Date End Date Taking? Authorizing Provider  azithromycin (ZITHROMAX) 250 MG tablet Take 1 tablet (250 mg total) by mouth daily. 1 tab every day until finished. 09/06/15   Nile Prisk Neva Seat, PA-C  naproxen sodium (ANAPROX) 220 MG tablet Take 220 mg by mouth 2 (two) times daily as needed (pain).    Historical Provider, MD  phenazopyridine (PYRIDIUM) 200 MG tablet Take 1 tablet (200 mg total) by mouth 3 (three) times daily with meals. 04/01/13   Elpidio Anis, PA-C  sulfamethoxazole-trimethoprim (BACTRIM DS) 800-160 MG per tablet Take 1 tablet by mouth 2 (two) times daily. 04/01/13   Elpidio Anis, PA-C  topiramate (TOPAMAX) 25 MG capsule Take 75 mg by mouth every evening.     Historical Provider, MD  venlafaxine XR (EFFEXOR-XR) 150 MG 24 hr capsule Take 150 mg by mouth every morning.     Historical Provider, MD   Triage vitals: BP 126/73 mmHg  Pulse 89  Temp(Src) 99.4 F (37.4 C) (Oral)  Resp 18  SpO2 99% Physical Exam  Constitutional: She appears well-developed and well-nourished. No distress.  HENT:  Head: Normocephalic and atraumatic.  Nasal congestion  Eyes: Conjunctivae are normal. Pupils are equal, round, and reactive to light. Right eye exhibits no discharge. Left eye exhibits no discharge.  Neck: Neck supple.  Cardiovascular: Normal rate, regular rhythm, normal heart sounds  and intact distal pulses.   Pulmonary/Chest: Effort normal. No respiratory distress. She has wheezes.  Mild wheezing and very small amount of rhonchi to RL lung field.  Abdominal: Soft. There is no tenderness.  Lymphadenopathy:    She has no cervical adenopathy.  Neurological: She is alert. Coordination normal.  Skin: Skin is warm and dry. No rash noted. She is not diaphoretic.  Psychiatric: She has a normal mood and affect. Her behavior is normal.  Nursing  note and vitals reviewed.   ED Course  Procedures  DIAGNOSTIC STUDIES: Oxygen Saturation is 99% on RA, normal by my interpretation.  COORDINATION OF CARE:  10:07 PM Discussed treatment plan with pt at bedside and pt agreed to plan.  Labs Review Labs Reviewed - No data to display  Imaging Review Dg Chest 2 View  09/06/2015  CLINICAL DATA:  Acute onset of generalized chest congestion and headache. Fever and chills. Initial encounter. EXAM: CHEST  2 VIEW COMPARISON:  Chest radiograph performed 06/29/2013 FINDINGS: The lungs are well-aerated and clear. There is no evidence of focal opacification, pleural effusion or pneumothorax. The heart is normal in size; the mediastinal contour is within normal limits. No acute osseous abnormalities are seen. IMPRESSION: No acute cardiopulmonary process seen. Electronically Signed   By: Roanna RaiderJeffery  Chang M.D.   On: 09/06/2015 21:31   I have personally reviewed and evaluated these images and lab results as part of my medical decision-making.   EKG Interpretation None      MDM   Final diagnoses:  URI (upper respiratory infection)  Nasal congestion    Patient had a normal chest xray, symptoms consistent with influenza like illness, however on lung exam she has some mild wheezing and rhonchi. Will cover with Azithromycin and Albuterol HFA. F/u with PCP and return precautions given.  I personally performed the services described in this documentation, which was scribed in my presence. The recorded information has been reviewed and is accurate.   Marlon Peliffany Kaspian Muccio, PA-C 09/06/15 2217  Nelva Nayobert Beaton, MD 09/09/15 1340

## 2015-09-06 NOTE — ED Notes (Addendum)
Pt states that she has had URI symptoms with headache and chest congestion x 1 week. States she has had fever/chills at home. Alert and oriented.

## 2015-09-06 NOTE — Discharge Instructions (Signed)
Upper Respiratory Infection, Adult Most upper respiratory infections (URIs) are a viral infection of the air passages leading to the lungs. A URI affects the nose, throat, and upper air passages. The most common type of URI is nasopharyngitis and is typically referred to as "the common cold." URIs run their course and usually go away on their own. Most of the time, a URI does not require medical attention, but sometimes a bacterial infection in the upper airways can follow a viral infection. This is called a secondary infection. Sinus and middle ear infections are common types of secondary upper respiratory infections. Bacterial pneumonia can also complicate a URI. A URI can worsen asthma and chronic obstructive pulmonary disease (COPD). Sometimes, these complications can require emergency medical care and may be life threatening.  CAUSES Almost all URIs are caused by viruses. A virus is a type of germ and can spread from one person to another.  RISKS FACTORS You may be at risk for a URI if:   You smoke.   You have chronic heart or lung disease.  You have a weakened defense (immune) system.   You are very young or very old.   You have nasal allergies or asthma.  You work in crowded or poorly ventilated areas.  You work in health care facilities or schools. SIGNS AND SYMPTOMS  Symptoms typically develop 2-3 days after you come in contact with a cold virus. Most viral URIs last 7-10 days. However, viral URIs from the influenza virus (flu virus) can last 14-18 days and are typically more severe. Symptoms may include:   Runny or stuffy (congested) nose.   Sneezing.   Cough.   Sore throat.   Headache.   Fatigue.   Fever.   Loss of appetite.   Pain in your forehead, behind your eyes, and over your cheekbones (sinus pain).  Muscle aches.  DIAGNOSIS  Your health care provider may diagnose a URI by:  Physical exam.  Tests to check that your symptoms are not due to  another condition such as:  Strep throat.  Sinusitis.  Pneumonia.  Asthma. TREATMENT  A URI goes away on its own with time. It cannot be cured with medicines, but medicines may be prescribed or recommended to relieve symptoms. Medicines may help:  Reduce your fever.  Reduce your cough.  Relieve nasal congestion. HOME CARE INSTRUCTIONS   Take medicines only as directed by your health care provider.   Gargle warm saltwater or take cough drops to comfort your throat as directed by your health care provider.  Use a warm mist humidifier or inhale steam from a shower to increase air moisture. This may make it easier to breathe.  Drink enough fluid to keep your urine clear or pale yellow.   Eat soups and other clear broths and maintain good nutrition.   Rest as needed.   Return to work when your temperature has returned to normal or as your health care provider advises. You may need to stay home longer to avoid infecting others. You can also use a face mask and careful hand washing to prevent spread of the virus.  Increase the usage of your inhaler if you have asthma.   Do not use any tobacco products, including cigarettes, chewing tobacco, or electronic cigarettes. If you need help quitting, ask your health care provider. PREVENTION  The best way to protect yourself from getting a cold is to practice good hygiene.   Avoid oral or hand contact with people with cold   symptoms.   Wash your hands often if contact occurs.  There is no clear evidence that vitamin C, vitamin E, echinacea, or exercise reduces the chance of developing a cold. However, it is always recommended to get plenty of rest, exercise, and practice good nutrition.  SEEK MEDICAL CARE IF:   You are getting worse rather than better.   Your symptoms are not controlled by medicine.   You have chills.  You have worsening shortness of breath.  You have brown or red mucus.  You have yellow or brown nasal  discharge.  You have pain in your face, especially when you bend forward.  You have a fever.  You have swollen neck glands.  You have pain while swallowing.  You have white areas in the back of your throat. SEEK IMMEDIATE MEDICAL CARE IF:   You have severe or persistent:  Headache.  Ear pain.  Sinus pain.  Chest pain.  You have chronic lung disease and any of the following:  Wheezing.  Prolonged cough.  Coughing up blood.  A change in your usual mucus.  You have a stiff neck.  You have changes in your:  Vision.  Hearing.  Thinking.  Mood. MAKE SURE YOU:   Understand these instructions.  Will watch your condition.  Will get help right away if you are not doing well or get worse.   This information is not intended to replace advice given to you by your health care provider. Make sure you discuss any questions you have with your health care provider.   Document Released: 11/23/2000 Document Revised: 10/14/2014 Document Reviewed: 09/04/2013 Elsevier Interactive Patient Education 2016 [employer]  

## 2016-07-20 ENCOUNTER — Other Ambulatory Visit: Payer: Self-pay | Admitting: Family Medicine

## 2016-07-20 DIAGNOSIS — N644 Mastodynia: Secondary | ICD-10-CM

## 2016-07-25 ENCOUNTER — Ambulatory Visit
Admission: RE | Admit: 2016-07-25 | Discharge: 2016-07-25 | Disposition: A | Discharge status: Home / Self Care | Payer: BLUE CROSS/BLUE SHIELD | Source: Ambulatory Visit | Attending: Family Medicine | Admitting: Family Medicine

## 2016-07-25 DIAGNOSIS — N644 Mastodynia: Secondary | ICD-10-CM

## 2020-02-24 DIAGNOSIS — L0293 Carbuncle, unspecified: Secondary | ICD-10-CM | POA: Diagnosis not present

## 2020-02-24 DIAGNOSIS — F4310 Post-traumatic stress disorder, unspecified: Secondary | ICD-10-CM | POA: Diagnosis not present

## 2020-03-05 DIAGNOSIS — E1165 Type 2 diabetes mellitus with hyperglycemia: Secondary | ICD-10-CM | POA: Diagnosis not present

## 2020-03-05 DIAGNOSIS — L304 Erythema intertrigo: Secondary | ICD-10-CM | POA: Diagnosis not present

## 2020-04-23 DIAGNOSIS — L732 Hidradenitis suppurativa: Secondary | ICD-10-CM | POA: Diagnosis not present

## 2020-04-23 DIAGNOSIS — N3000 Acute cystitis without hematuria: Secondary | ICD-10-CM | POA: Diagnosis not present

## 2020-05-20 DIAGNOSIS — R059 Cough, unspecified: Secondary | ICD-10-CM | POA: Diagnosis not present

## 2020-05-20 DIAGNOSIS — J069 Acute upper respiratory infection, unspecified: Secondary | ICD-10-CM | POA: Diagnosis not present

## 2020-06-08 ENCOUNTER — Other Ambulatory Visit: Payer: Self-pay | Admitting: Family Medicine

## 2020-06-08 ENCOUNTER — Other Ambulatory Visit (HOSPITAL_COMMUNITY)
Admission: RE | Admit: 2020-06-08 | Discharge: 2020-06-08 | Disposition: A | Discharge status: Home / Self Care | Payer: BLUE CROSS/BLUE SHIELD | Source: Ambulatory Visit | Attending: Family Medicine | Admitting: Family Medicine

## 2020-06-08 DIAGNOSIS — Z124 Encounter for screening for malignant neoplasm of cervix: Secondary | ICD-10-CM | POA: Diagnosis not present

## 2020-06-08 DIAGNOSIS — Z5181 Encounter for therapeutic drug level monitoring: Secondary | ICD-10-CM | POA: Diagnosis not present

## 2020-06-08 DIAGNOSIS — Z01411 Encounter for gynecological examination (general) (routine) with abnormal findings: Secondary | ICD-10-CM | POA: Diagnosis not present

## 2020-06-08 DIAGNOSIS — E785 Hyperlipidemia, unspecified: Secondary | ICD-10-CM | POA: Diagnosis not present

## 2020-06-08 DIAGNOSIS — Z0000 Encounter for general adult medical examination without abnormal findings: Secondary | ICD-10-CM | POA: Diagnosis not present

## 2020-06-08 DIAGNOSIS — E1165 Type 2 diabetes mellitus with hyperglycemia: Secondary | ICD-10-CM | POA: Diagnosis not present

## 2020-06-08 DIAGNOSIS — E559 Vitamin D deficiency, unspecified: Secondary | ICD-10-CM | POA: Diagnosis not present

## 2020-06-12 LAB — CYTOLOGY - PAP
Comment: NEGATIVE
Diagnosis: NEGATIVE
Diagnosis: REACTIVE
High risk HPV: POSITIVE — AB

## 2020-08-14 DIAGNOSIS — R5382 Chronic fatigue, unspecified: Secondary | ICD-10-CM | POA: Diagnosis not present

## 2020-08-14 DIAGNOSIS — R399 Unspecified symptoms and signs involving the genitourinary system: Secondary | ICD-10-CM | POA: Diagnosis not present

## 2020-08-14 DIAGNOSIS — F988 Other specified behavioral and emotional disorders with onset usually occurring in childhood and adolescence: Secondary | ICD-10-CM | POA: Diagnosis not present

## 2020-08-14 DIAGNOSIS — E611 Iron deficiency: Secondary | ICD-10-CM | POA: Diagnosis not present

## 2020-08-14 DIAGNOSIS — E538 Deficiency of other specified B group vitamins: Secondary | ICD-10-CM | POA: Diagnosis not present

## 2020-08-14 DIAGNOSIS — N951 Menopausal and female climacteric states: Secondary | ICD-10-CM | POA: Diagnosis not present

## 2021-04-02 DIAGNOSIS — G4700 Insomnia, unspecified: Secondary | ICD-10-CM | POA: Diagnosis not present

## 2021-04-02 DIAGNOSIS — H9319 Tinnitus, unspecified ear: Secondary | ICD-10-CM | POA: Diagnosis not present

## 2021-04-02 DIAGNOSIS — K2210 Ulcer of esophagus without bleeding: Secondary | ICD-10-CM | POA: Diagnosis not present

## 2021-04-19 ENCOUNTER — Other Ambulatory Visit: Payer: Self-pay | Admitting: Nurse Practitioner

## 2021-04-19 DIAGNOSIS — Z09 Encounter for follow-up examination after completed treatment for conditions other than malignant neoplasm: Secondary | ICD-10-CM

## 2021-05-12 ENCOUNTER — Other Ambulatory Visit: Payer: Self-pay | Admitting: Gastroenterology

## 2021-05-12 DIAGNOSIS — R1011 Right upper quadrant pain: Secondary | ICD-10-CM

## 2021-05-12 DIAGNOSIS — K649 Unspecified hemorrhoids: Secondary | ICD-10-CM | POA: Diagnosis not present

## 2021-05-12 DIAGNOSIS — K5900 Constipation, unspecified: Secondary | ICD-10-CM | POA: Diagnosis not present

## 2021-05-12 DIAGNOSIS — K625 Hemorrhage of anus and rectum: Secondary | ICD-10-CM | POA: Diagnosis not present

## 2021-05-20 DIAGNOSIS — H903 Sensorineural hearing loss, bilateral: Secondary | ICD-10-CM | POA: Diagnosis not present

## 2021-05-20 DIAGNOSIS — H9313 Tinnitus, bilateral: Secondary | ICD-10-CM | POA: Diagnosis not present

## 2021-05-24 ENCOUNTER — Ambulatory Visit
Admission: RE | Admit: 2021-05-24 | Discharge: 2021-05-24 | Disposition: A | Discharge status: Home / Self Care | Payer: BLUE CROSS/BLUE SHIELD | Source: Ambulatory Visit | Attending: Nurse Practitioner | Admitting: Nurse Practitioner

## 2021-05-24 ENCOUNTER — Ambulatory Visit
Admission: RE | Admit: 2021-05-24 | Discharge: 2021-05-24 | Disposition: A | Discharge status: Home / Self Care | Payer: BC Managed Care – PPO | Source: Ambulatory Visit | Attending: Nurse Practitioner | Admitting: Nurse Practitioner

## 2021-05-24 DIAGNOSIS — Z09 Encounter for follow-up examination after completed treatment for conditions other than malignant neoplasm: Secondary | ICD-10-CM

## 2021-05-24 DIAGNOSIS — R922 Inconclusive mammogram: Secondary | ICD-10-CM | POA: Diagnosis not present

## 2021-05-25 ENCOUNTER — Inpatient Hospital Stay: Admission: RE | Admit: 2021-05-25 | Payer: BLUE CROSS/BLUE SHIELD | Source: Ambulatory Visit

## 2021-05-25 DIAGNOSIS — R079 Chest pain, unspecified: Secondary | ICD-10-CM | POA: Diagnosis not present

## 2021-05-25 DIAGNOSIS — R0789 Other chest pain: Secondary | ICD-10-CM | POA: Diagnosis not present

## 2021-05-27 DIAGNOSIS — R079 Chest pain, unspecified: Secondary | ICD-10-CM | POA: Diagnosis not present

## 2021-06-29 ENCOUNTER — Other Ambulatory Visit: Payer: Self-pay | Admitting: Family Medicine

## 2021-06-29 ENCOUNTER — Other Ambulatory Visit (HOSPITAL_COMMUNITY)
Admission: RE | Admit: 2021-06-29 | Discharge: 2021-06-29 | Disposition: A | Discharge status: Home / Self Care | Payer: BC Managed Care – PPO | Source: Ambulatory Visit | Attending: Family Medicine | Admitting: Family Medicine

## 2021-06-29 DIAGNOSIS — Z0000 Encounter for general adult medical examination without abnormal findings: Secondary | ICD-10-CM | POA: Diagnosis not present

## 2021-06-29 DIAGNOSIS — Z124 Encounter for screening for malignant neoplasm of cervix: Secondary | ICD-10-CM | POA: Diagnosis not present

## 2021-06-29 DIAGNOSIS — E785 Hyperlipidemia, unspecified: Secondary | ICD-10-CM | POA: Diagnosis not present

## 2021-06-29 DIAGNOSIS — G4700 Insomnia, unspecified: Secondary | ICD-10-CM | POA: Diagnosis not present

## 2021-06-29 DIAGNOSIS — E1165 Type 2 diabetes mellitus with hyperglycemia: Secondary | ICD-10-CM | POA: Diagnosis not present

## 2021-06-29 DIAGNOSIS — E559 Vitamin D deficiency, unspecified: Secondary | ICD-10-CM | POA: Diagnosis not present

## 2021-07-01 LAB — CYTOLOGY - PAP
Comment: NEGATIVE
Diagnosis: NEGATIVE
High risk HPV: NEGATIVE

## 2021-08-17 DIAGNOSIS — R1310 Dysphagia, unspecified: Secondary | ICD-10-CM | POA: Diagnosis not present

## 2021-08-17 DIAGNOSIS — Z1211 Encounter for screening for malignant neoplasm of colon: Secondary | ICD-10-CM | POA: Diagnosis not present

## 2021-08-17 DIAGNOSIS — K5730 Diverticulosis of large intestine without perforation or abscess without bleeding: Secondary | ICD-10-CM | POA: Diagnosis not present

## 2021-08-17 DIAGNOSIS — K449 Diaphragmatic hernia without obstruction or gangrene: Secondary | ICD-10-CM | POA: Diagnosis not present

## 2021-08-17 DIAGNOSIS — K2289 Other specified disease of esophagus: Secondary | ICD-10-CM | POA: Diagnosis not present

## 2021-09-28 DIAGNOSIS — E785 Hyperlipidemia, unspecified: Secondary | ICD-10-CM | POA: Diagnosis not present

## 2021-09-28 DIAGNOSIS — E1165 Type 2 diabetes mellitus with hyperglycemia: Secondary | ICD-10-CM | POA: Diagnosis not present

## 2021-09-28 DIAGNOSIS — I10 Essential (primary) hypertension: Secondary | ICD-10-CM | POA: Diagnosis not present

## 2021-09-28 DIAGNOSIS — G4700 Insomnia, unspecified: Secondary | ICD-10-CM | POA: Diagnosis not present

## 2021-10-19 DIAGNOSIS — Z5321 Procedure and treatment not carried out due to patient leaving prior to being seen by health care provider: Secondary | ICD-10-CM | POA: Diagnosis not present

## 2021-10-19 DIAGNOSIS — R0789 Other chest pain: Secondary | ICD-10-CM | POA: Diagnosis not present

## 2021-10-20 DIAGNOSIS — R0789 Other chest pain: Secondary | ICD-10-CM | POA: Diagnosis not present

## 2021-10-20 DIAGNOSIS — Z5321 Procedure and treatment not carried out due to patient leaving prior to being seen by health care provider: Secondary | ICD-10-CM | POA: Diagnosis not present

## 2021-10-20 DIAGNOSIS — R079 Chest pain, unspecified: Secondary | ICD-10-CM | POA: Diagnosis not present

## 2021-10-28 DIAGNOSIS — I10 Essential (primary) hypertension: Secondary | ICD-10-CM | POA: Diagnosis not present

## 2021-10-28 DIAGNOSIS — F1320 Sedative, hypnotic or anxiolytic dependence, uncomplicated: Secondary | ICD-10-CM | POA: Diagnosis not present

## 2021-10-28 DIAGNOSIS — E1165 Type 2 diabetes mellitus with hyperglycemia: Secondary | ICD-10-CM | POA: Diagnosis not present

## 2021-12-29 DIAGNOSIS — E1165 Type 2 diabetes mellitus with hyperglycemia: Secondary | ICD-10-CM | POA: Diagnosis not present

## 2021-12-29 DIAGNOSIS — E785 Hyperlipidemia, unspecified: Secondary | ICD-10-CM | POA: Diagnosis not present

## 2022-01-03 ENCOUNTER — Other Ambulatory Visit: Payer: Self-pay

## 2022-01-03 ENCOUNTER — Emergency Department (HOSPITAL_COMMUNITY): Payer: BC Managed Care – PPO

## 2022-01-03 ENCOUNTER — Emergency Department (HOSPITAL_COMMUNITY)
Admission: EM | Admit: 2022-01-03 | Discharge: 2022-01-03 | Disposition: A | Discharge status: Home / Self Care | Payer: BC Managed Care – PPO | Attending: Emergency Medicine | Admitting: Emergency Medicine

## 2022-01-03 DIAGNOSIS — R112 Nausea with vomiting, unspecified: Secondary | ICD-10-CM | POA: Diagnosis not present

## 2022-01-03 DIAGNOSIS — I10 Essential (primary) hypertension: Secondary | ICD-10-CM | POA: Diagnosis not present

## 2022-01-03 DIAGNOSIS — J189 Pneumonia, unspecified organism: Secondary | ICD-10-CM | POA: Diagnosis not present

## 2022-01-03 DIAGNOSIS — Z5321 Procedure and treatment not carried out due to patient leaving prior to being seen by health care provider: Secondary | ICD-10-CM | POA: Diagnosis not present

## 2022-01-03 DIAGNOSIS — R0789 Other chest pain: Secondary | ICD-10-CM | POA: Diagnosis not present

## 2022-01-03 DIAGNOSIS — R079 Chest pain, unspecified: Secondary | ICD-10-CM | POA: Diagnosis not present

## 2022-01-03 LAB — TROPONIN I (HIGH SENSITIVITY): Troponin I (High Sensitivity): 4 ng/L (ref <18)

## 2022-01-03 LAB — CBC
HCT: 42.9 % (ref 36.0–46.0)
Hemoglobin: 14.7 g/dL (ref 12.0–15.0)
MCH: 30.9 pg (ref 26.0–34.0)
MCHC: 34.3 g/dL (ref 30.0–36.0)
MCV: 90.3 fL (ref 80.0–100.0)
Platelets: 377 10*3/uL (ref 150–400)
RBC: 4.75 MIL/uL (ref 3.87–5.11)
RDW: 12.1 % (ref 11.5–15.5)
WBC: 9.5 10*3/uL (ref 4.0–10.5)
nRBC: 0 % (ref 0.0–0.2)

## 2022-01-03 LAB — BASIC METABOLIC PANEL
Anion gap: 8 (ref 5–15)
BUN: 8 mg/dL (ref 6–20)
CO2: 28 mmol/L (ref 22–32)
Calcium: 9.1 mg/dL (ref 8.9–10.3)
Chloride: 104 mmol/L (ref 98–111)
Creatinine, Ser: 0.74 mg/dL (ref 0.44–1.00)
GFR, Estimated: >60 mL/min (ref >60)
Glucose, Bld: 92 mg/dL (ref 70–99)
Potassium: 4 mmol/L (ref 3.5–5.1)
Sodium: 140 mmol/L (ref 135–145)

## 2022-01-03 LAB — TSH: TSH: 1.704 u[IU]/mL (ref 0.350–4.500)

## 2022-01-03 LAB — MAGNESIUM: Magnesium: 1.9 mg/dL (ref 1.7–2.4)

## 2022-01-03 NOTE — ED Provider Triage Note (Signed)
Emergency Medicine Provider Triage Evaluation Note  [name] , a 56 y.o. female  was evaluated in triage.  Pt complains of chest pain.  Sharp under the left breast rating to the back.  The symptoms have been going on for a couple months.  Her symptoms seem to be getting more frequent and lasting longer.  Pain last for up to 10 minutes at a time but she feels that she is having palpitations and racing in her heart.  She feels like she is having episodes where she might pass out.  She has some associated shortness of breath at the time that it is occurring.  She denies any active shortness of breath at this time.  She supposed to follow-up with cardiology per her PCP but has not yet been able to.  Review of Systems  Positive: Chest pain Negative: Fever  Physical Exam  There were no vitals taken for this visit. Gen:   Awake, no distress   Resp:  Normal effort  MSK:   Moves extremities without difficulty  Other:  Appears anxious, no chest wall tenderness  Medical Decision Making  Medically screening exam initiated at 3:47 PM.  Appropriate orders placed.  BARBERA PERRITT was informed that the remainder of the evaluation will be completed by another provider, this initial triage assessment does not replace that evaluation, and the importance of remaining in the ED until their evaluation is complete.  Work-up initiated   Arthor Captain, PA-C 01/03/22 1551

## 2022-01-03 NOTE — ED Triage Notes (Signed)
Pt c/o intermittent episodes of CP/dizziness "like I'm going to pass out x"weeks," advises "more than usual today." States she feels like her heart is "racing/skipping, like she'll pass out." Associated fatigue during episodes. Hx HTN, HLD, compliant w all home meds PCP advised cardiology follow up, pt felt pain was too emergent to wait for appt.

## 2022-01-07 DIAGNOSIS — E785 Hyperlipidemia, unspecified: Secondary | ICD-10-CM | POA: Diagnosis not present

## 2022-01-07 DIAGNOSIS — E1165 Type 2 diabetes mellitus with hyperglycemia: Secondary | ICD-10-CM | POA: Diagnosis not present

## 2022-01-07 DIAGNOSIS — R002 Palpitations: Secondary | ICD-10-CM | POA: Diagnosis not present

## 2022-01-10 IMAGING — MG DIGITAL DIAGNOSTIC BILAT W/ TOMO W/ CAD
8 series · 8 of 24 positions shown · non-contrast
Comparison: Previous exam(s).

CLINICAL DATA: 54-year-old female presenting for annual bilateral
mammogram and delayed follow-up of a probably benign left breast
mass.

EXAM:
DIGITAL DIAGNOSTIC BILATERAL MAMMOGRAM WITH TOMOSYNTHESIS AND CAD;
ULTRASOUND LEFT BREAST LIMITED
TECHNIQUE: Bilateral digital diagnostic mammography and breast tomosynthesis
was performed. The images were evaluated with computer-aided
detection.; Targeted ultrasound examination of the left breast was
performed.

[L MLO synth-2D]
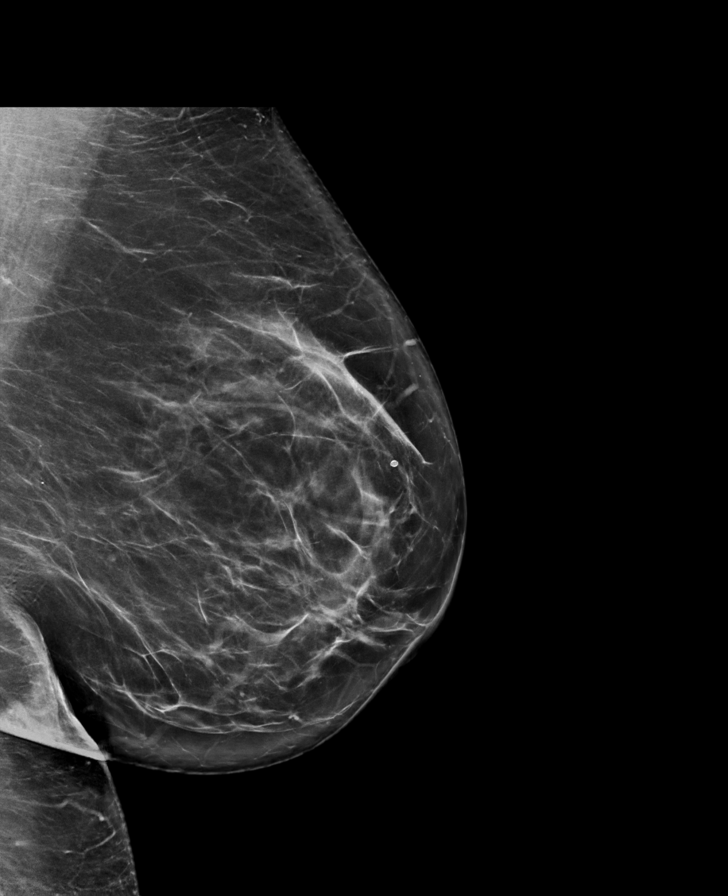

[L CC synth-2D]
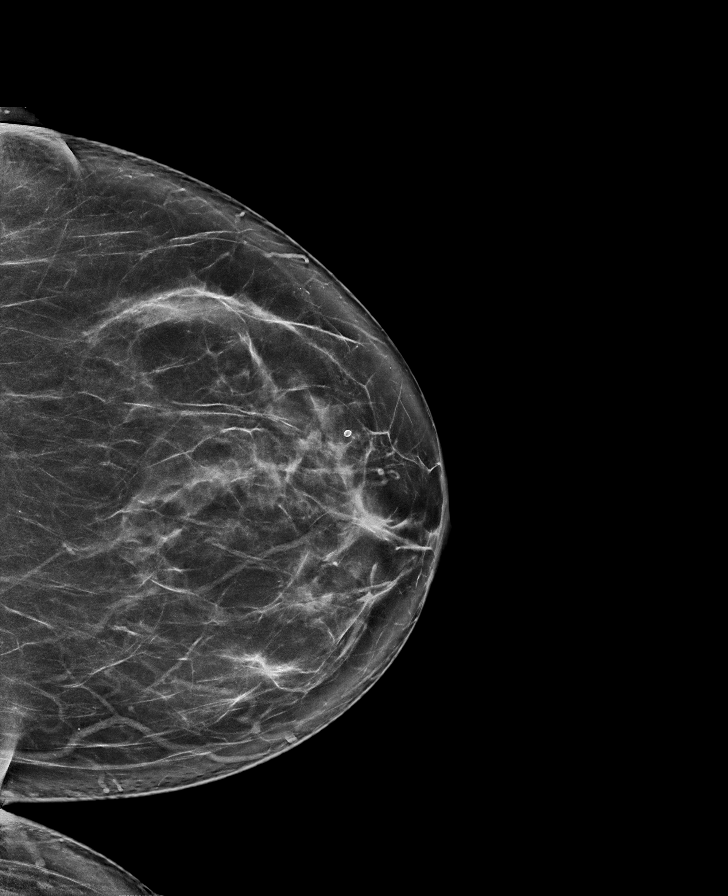

[R CC synth-2D]
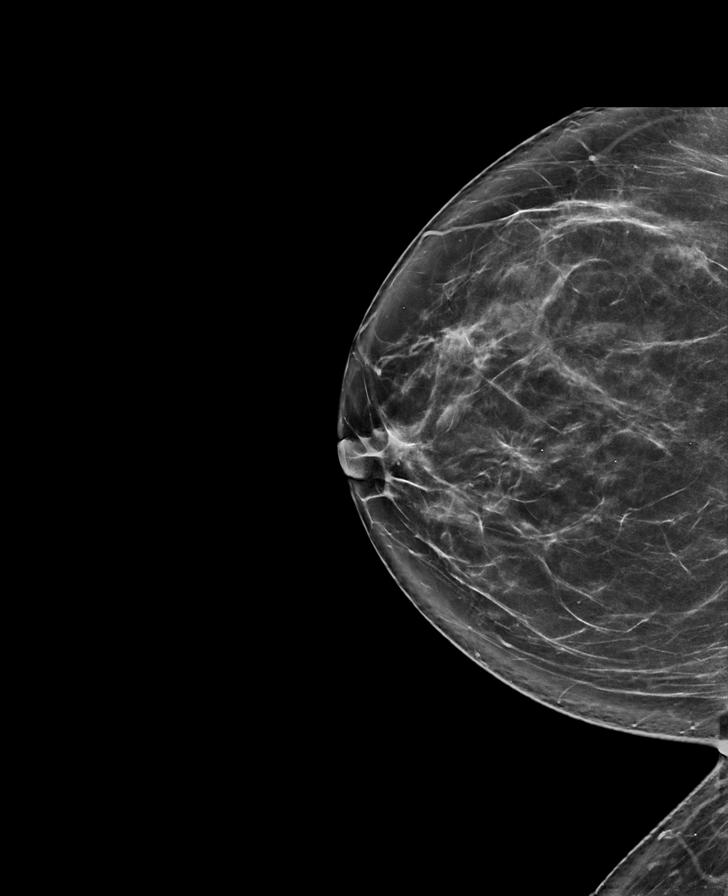

[R MLO synth-2D]
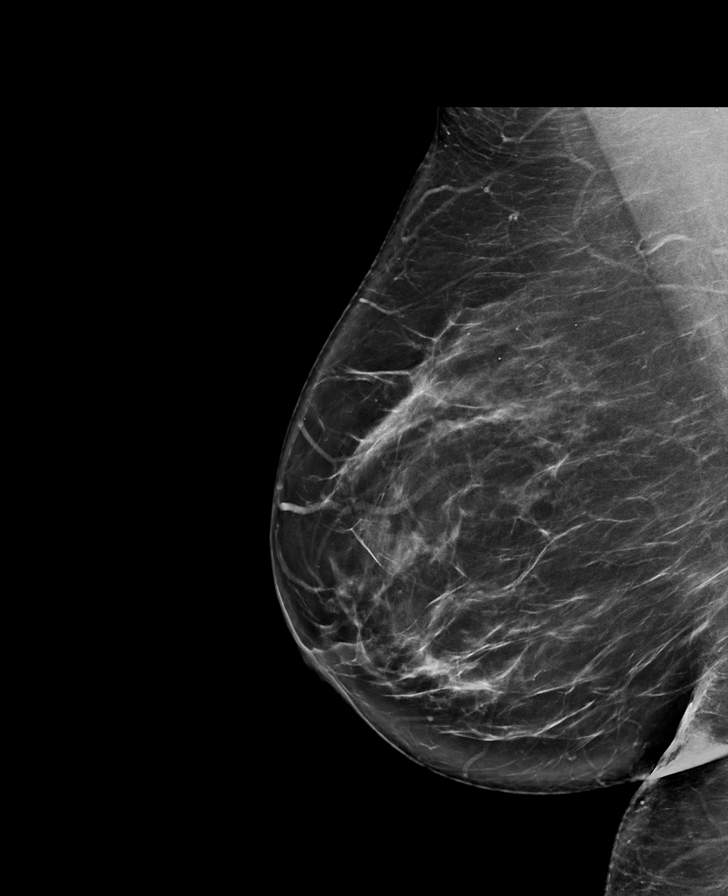

[R MLO tomo · tomo slice 45/90.0]
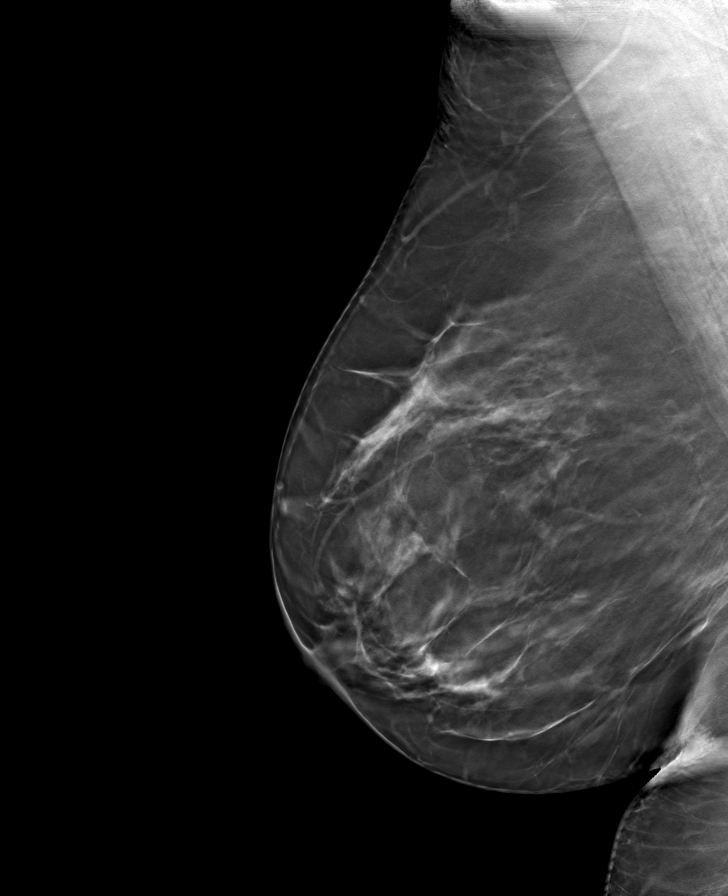

[L CC tomo · tomo slice 43/84.0]
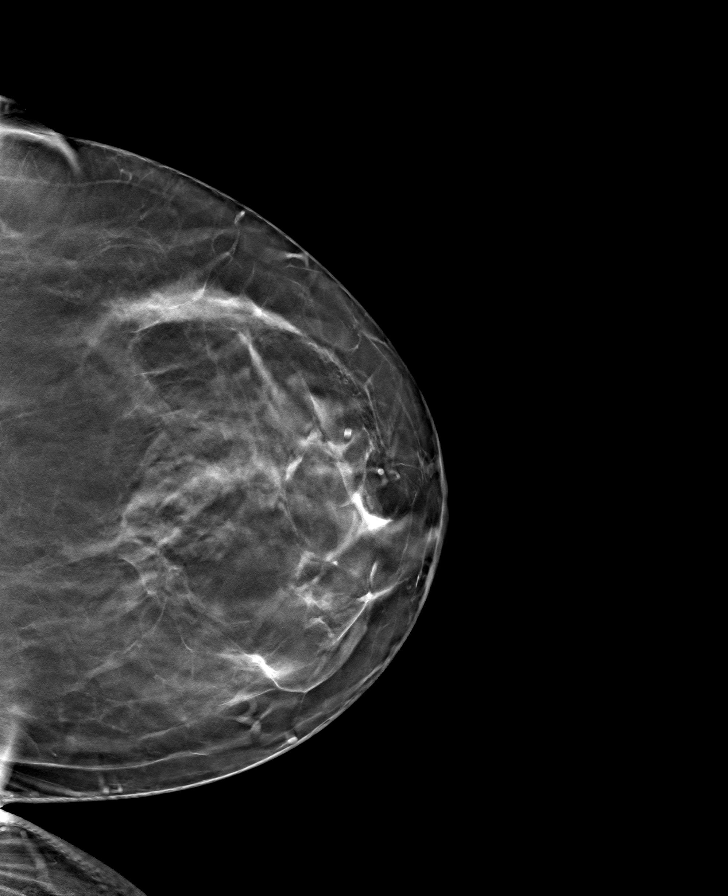

[R CC tomo · tomo slice 42/83.0]
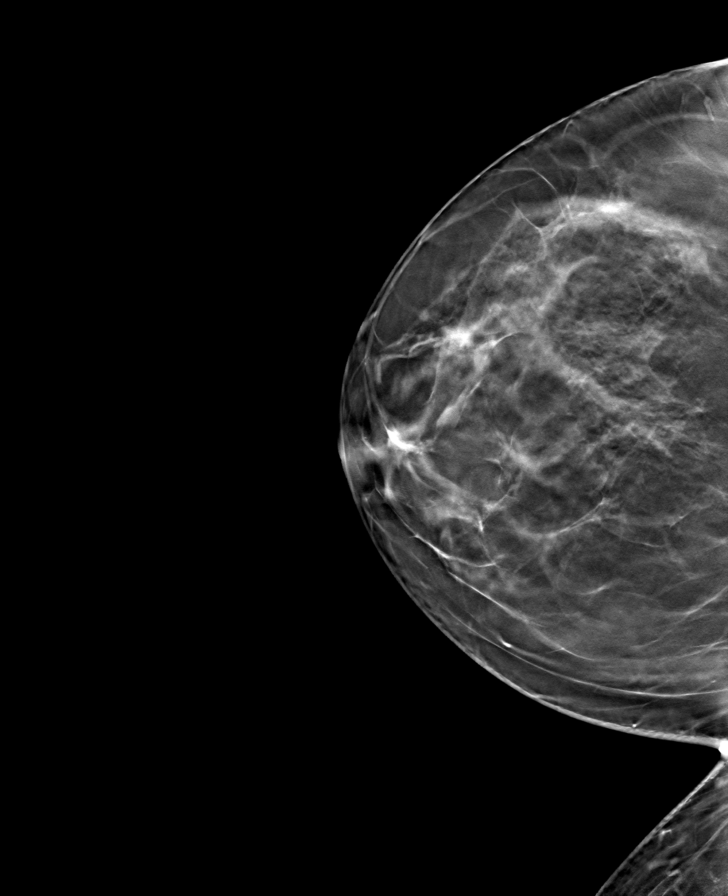

[L MLO tomo · tomo slice 45/89.0]
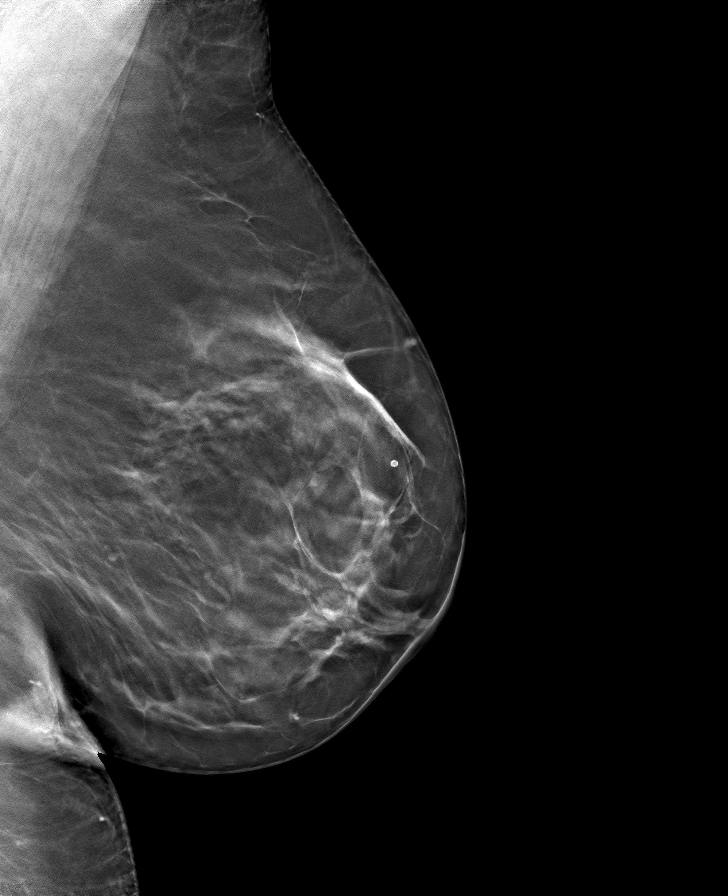

[8 of 24 positions shown; findings below may reference images not displayed]

ACR Breast Density Category b: There are scattered areas of
fibroglandular density.
FINDINGS: No focal or suspicious mammographic findings are identified in
either breast. The parenchymal pattern is stable.

Targeted ultrasound is performed, showing interval resolution of the
previously identified mass at the [DATE] position 1 cm from the nipple
on the left.
IMPRESSION: 1. No mammographic evidence of malignancy in either breast.
2. Interval resolution of the previously identified left breast
mass.

RECOMMENDATION:
Screening mammogram in one year.(Code:JE-[id number])

I have discussed the findings and recommendations with the patient.
If applicable, a reminder letter will be sent to the patient
regarding the next appointment.

BI-RADS CATEGORY  1: Negative.

## 2022-01-10 IMAGING — US US BREAST*L* LIMITED INC AXILLA
1 series · 5 of 5 positions shown · non-contrast
Comparison: Previous exam(s).

CLINICAL DATA: 54-year-old female presenting for annual bilateral
mammogram and delayed follow-up of a probably benign left breast
mass.

EXAM:
DIGITAL DIAGNOSTIC BILATERAL MAMMOGRAM WITH TOMOSYNTHESIS AND CAD;
ULTRASOUND LEFT BREAST LIMITED
TECHNIQUE: Bilateral digital diagnostic mammography and breast tomosynthesis
was performed. The images were evaluated with computer-aided
detection.; Targeted ultrasound examination of the left breast was
performed.

[Series 1: us breast*left* limited inc axilla · 0.05mm/px · 5 of 5 slices shown]
[im 1/5]
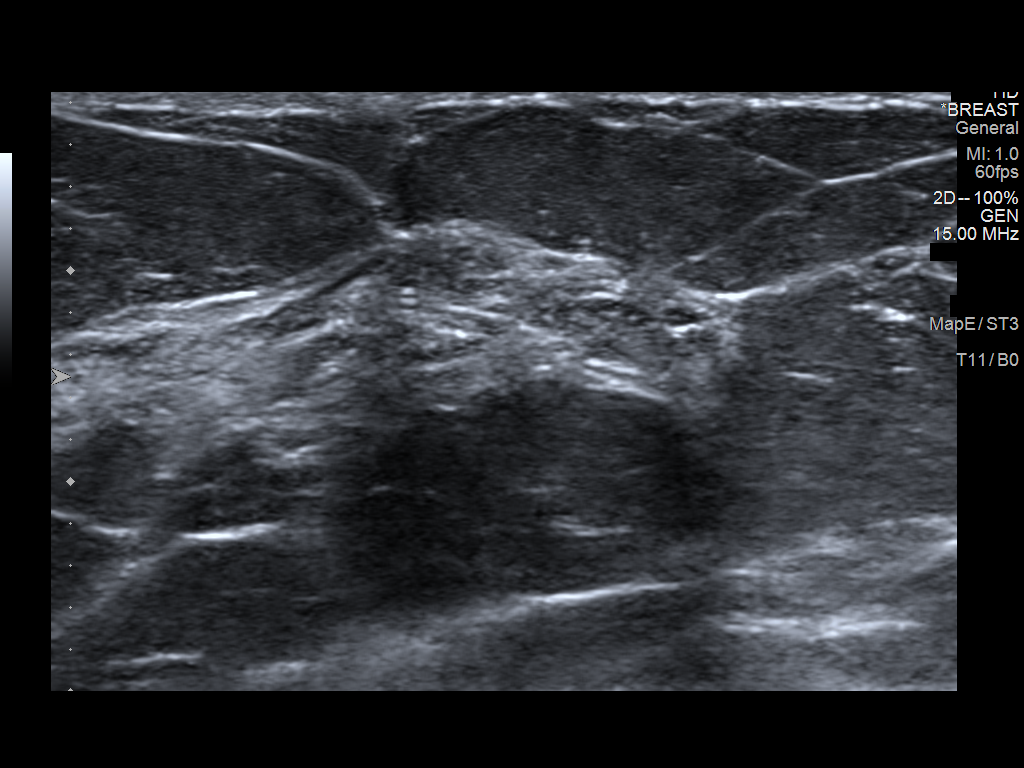
[im 2/5]
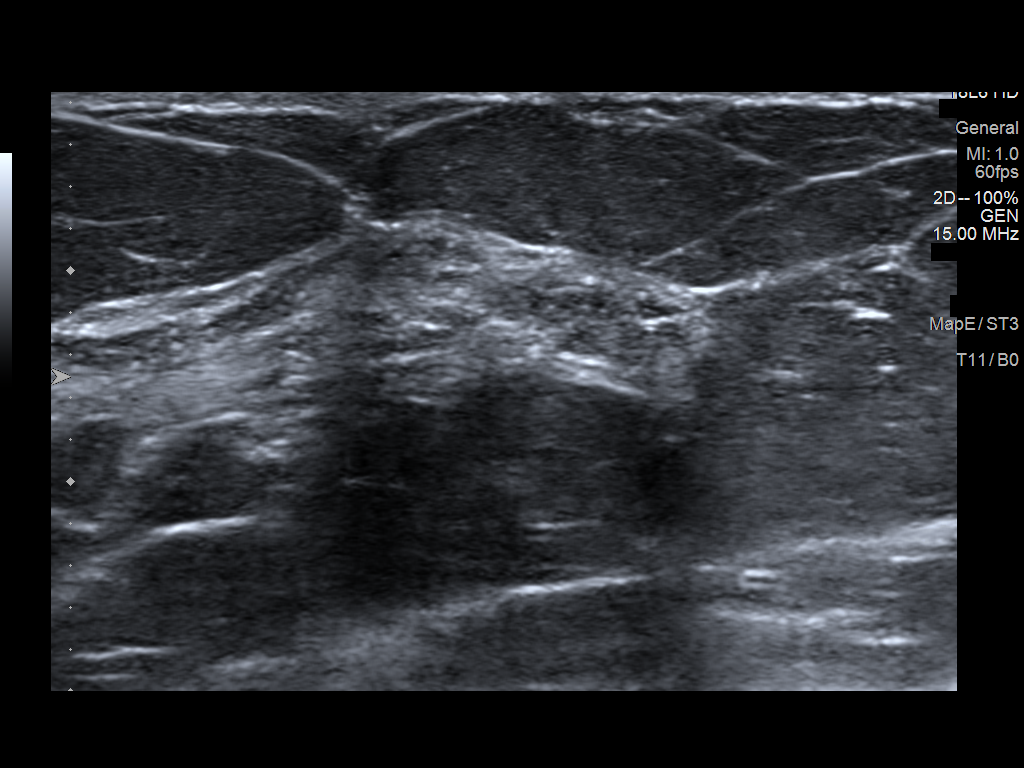
[im 3/5]
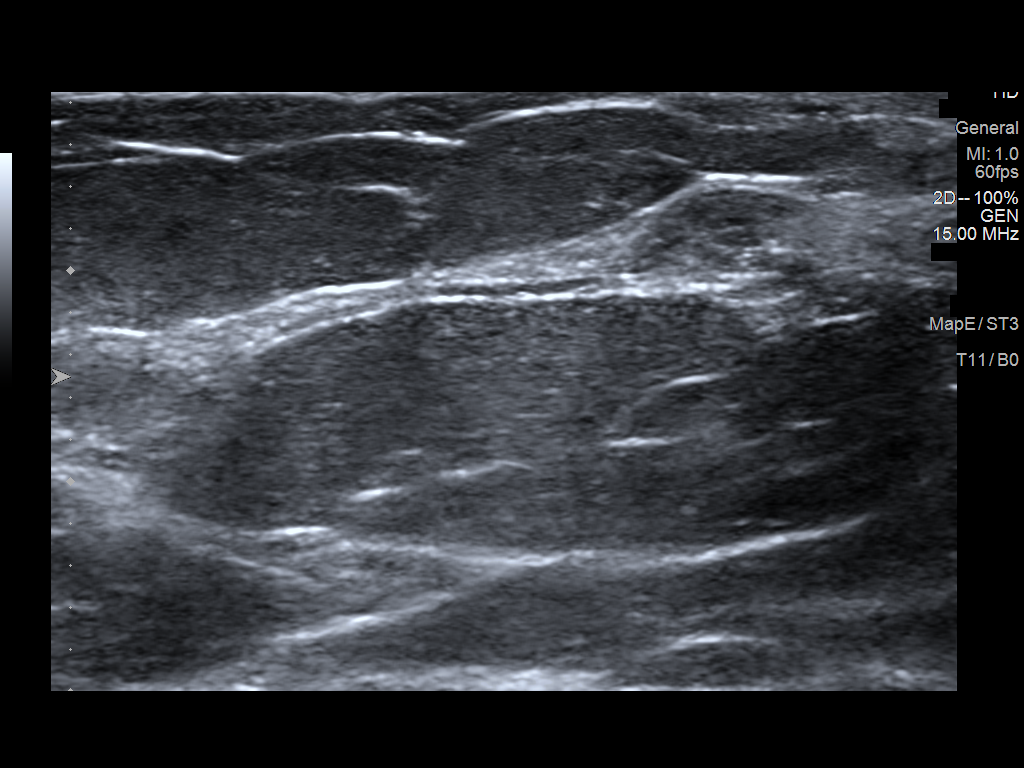
[im 4/5]
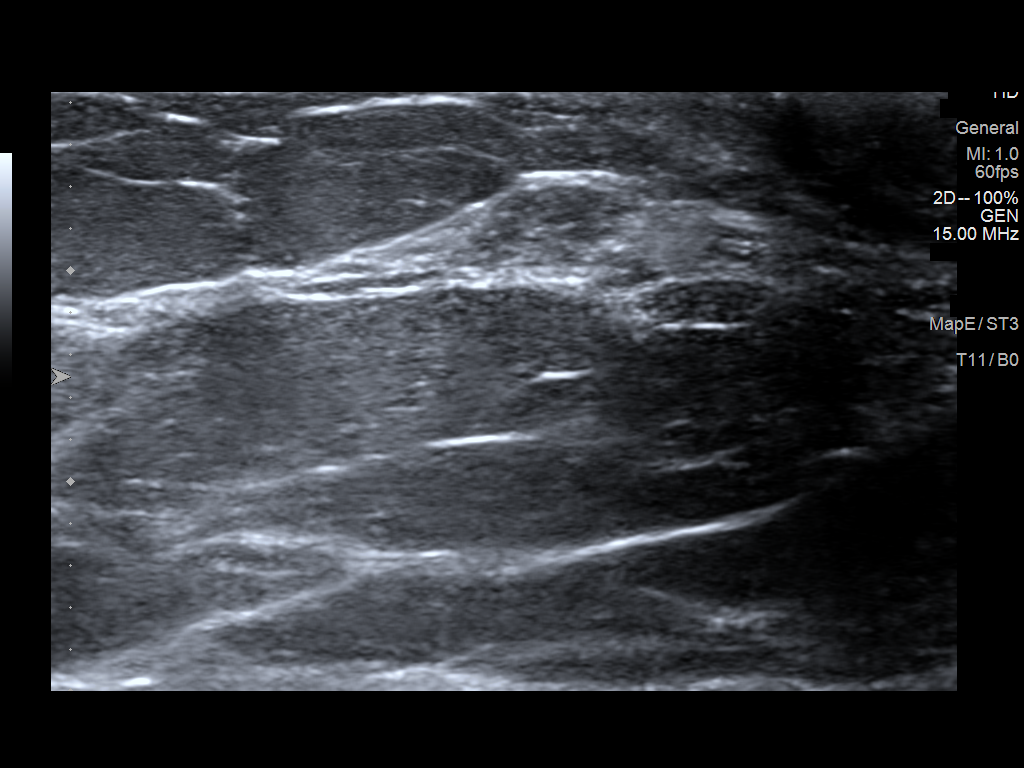
[im 5/5]
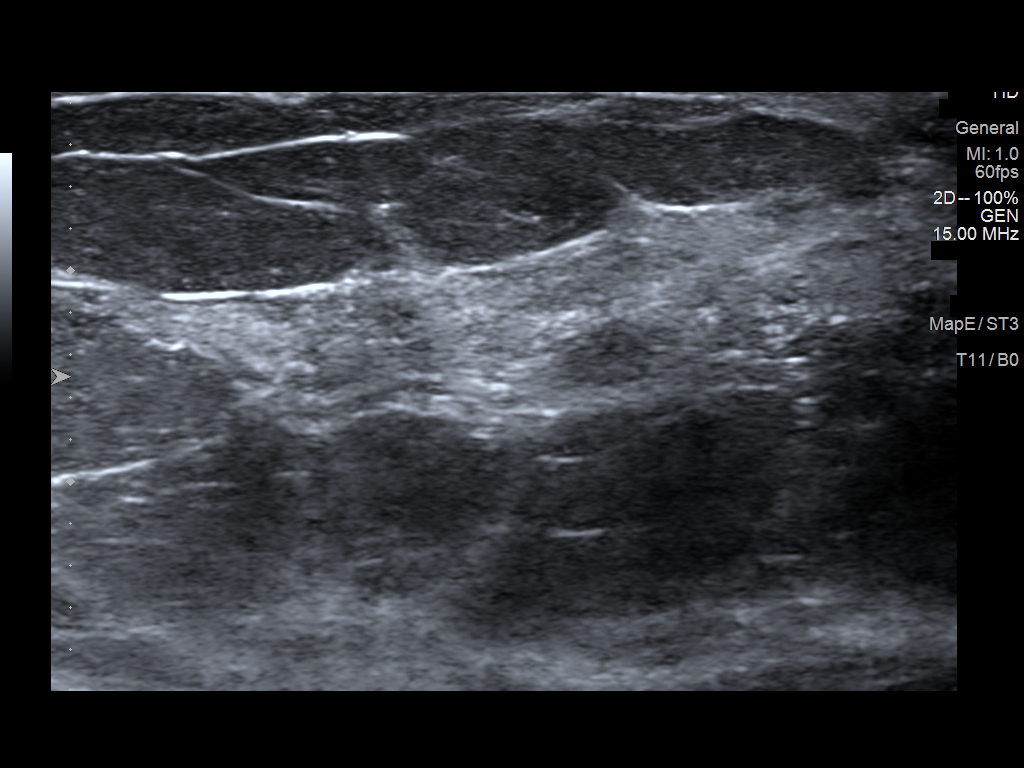

[5 of 5 positions shown; findings below may reference images not displayed]

ACR Breast Density Category b: There are scattered areas of
fibroglandular density.
FINDINGS: No focal or suspicious mammographic findings are identified in
either breast. The parenchymal pattern is stable.

Targeted ultrasound is performed, showing interval resolution of the
previously identified mass at the [DATE] position 1 cm from the nipple
on the left.
IMPRESSION: 1. No mammographic evidence of malignancy in either breast.
2. Interval resolution of the previously identified left breast
mass.

RECOMMENDATION:
Screening mammogram in one year.(Code:JE-[id number])

I have discussed the findings and recommendations with the patient.
If applicable, a reminder letter will be sent to the patient
regarding the next appointment.

BI-RADS CATEGORY  1: Negative.

## 2022-01-23 NOTE — Progress Notes (Unsigned)
Cardiology Office Note:    Date:  01/27/2022   ID:  Julien Girt, DOB 11-06-1965, MRN [id number]  PCP:  Oneita Hurt, No   [facility] Providers Cardiologist:  None     Referring MD: Camie Patience, FNP   Chief Complaint  Patient presents with   Chest Pain    History of Present Illness:    [name] is a 56 y.o. female who is seen at the request of Jorge Ny FNP for evaluation of palpitations and left sided chest pain. She has a history of HTN, DM type 2 and HLD. Also history of tobacco abuse and family history of CAD.  She complains of a 2 month history of chest pain. It is located under her left breast and radiates into her neck and back. No known triggers including activity. In addition she describes episodes of "stupid tired" where she will suddenly feel weak and exhausted and she has to lie down. She feels it washing over her from her shoulders down. She does note some skipping at times. Rare SOB. She has lost 40 lbs recently intentionally. No prior cardiac evaluation other than Ecgs.   Past Medical History:  Diagnosis Date   Diabetes mellitus without complication (HCC)    Hyperlipidemia    Hypertension    Migraine    Pneumonia     Past Surgical History:  Procedure Laterality Date   ABLATION     TUBAL LIGATION      Current Medications: Current Meds  Medication Sig   amitriptyline (ELAVIL) 50 MG tablet Take 50 mg by mouth at bedtime.   lisinopril (ZESTRIL) 10 MG tablet Take 10 mg by mouth daily.   metFORMIN (GLUCOPHAGE-XR) 500 MG 24 hr tablet Take 500 mg by mouth daily.   metoprolol tartrate (LOPRESSOR) 100 MG tablet Take 100 mg 2 hours before Coronary CT   naproxen sodium (ANAPROX) 220 MG tablet Take 220 mg by mouth 2 (two) times daily as needed (pain).   omeprazole (PRILOSEC) 40 MG capsule 1 capsule 30 minutes before morning meal   polyethylene glycol powder (MIRALAX) 17 GM/SCOOP powder 1 scoop mixed with 8 ounces of fluid   Red Yeast Rice 600  MG CAPS 2 capsules   venlafaxine XR (EFFEXOR-XR) 150 MG 24 hr capsule Take 150 mg by mouth every morning.      Allergies:   Klonopin [clonazepam] and Shellfish allergy   Social History   Socioeconomic History   Marital status: Divorced    Spouse name: Not on file   Number of children: 3   Years of education: Not on file   Highest education level: Not on file  Occupational History   Not on file  Tobacco Use   Smoking status: Every Day    Packs/day: 1.00    Types: Cigarettes   Smokeless tobacco: Not on file  Vaping Use   Vaping Use: Every day  Substance and Sexual Activity   Alcohol use: Yes    Comment: socially   Drug use: No   Sexual activity: Not on file  Other Topics Concern   Not on file  Social History Narrative   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  ROS:   Please see the history of present illness.     All other systems reviewed and are negative.  EKGs/Labs/Other Studies Reviewed:    The following studies were reviewed today:   EKG:  EKG is  ordered today.  The ekg ordered today demonstrates NSR rate 95. Normal. I have personally reviewed and interpreted this study.    Recent Labs: 01/03/2022: BUN 8; Creatinine, Ser 0.74; Hemoglobin 14.7; Magnesium 1.9; Platelets 377; Potassium 4.0; Sodium 140; TSH 1.704  Recent Lipid Panel No results found for: "CHOL", "TRIG", "HDL", "CHOLHDL", "VLDL", "LDLCALC", "LDLDIRECT" Dated 06/29/21: normal LFTs.  Dated 01/07/22: A1c 5.9%. cholesterol 210, triglycerides 158, HDL 38, LDL 143.   Risk  Assessment/Calculations:           Physical Exam:    VS:  BP 128/84   Pulse 95   Ht 5\' 2"  (1.575 m)   Wt 196 lb (88.9 kg)   SpO2 96%   BMI 35.85 kg/m     Wt Readings from Last 3 Encounters:  01/27/22 196 lb (88.9 kg)  04/01/13 147 lb (66.7 kg)  12/19/11 185 lb (83.9 kg)     GEN:  Well nourished, well developed in no acute distress HEENT: Normal NECK: No JVD; No carotid bruits LYMPHATICS: No lymphadenopathy CARDIAC: RRR, no murmurs, rubs, gallops RESPIRATORY:  Clear to auscultation without rales, wheezing or rhonchi  ABDOMEN: Soft, non-tender, non-distended MUSCULOSKELETAL:  No edema; No deformity  SKIN: Warm and dry NEUROLOGIC:  Alert and oriented x 3 PSYCHIATRIC:  Normal affect   ASSESSMENT:    1. Chest pain of uncertain etiology   2. Type 2 diabetes mellitus without complication, without long-term current use of insulin (HCC)   3. Primary hypertension   4. Mixed hyperlipidemia   5. Tobacco abuse   6. Precordial pain    PLAN:    In order of problems listed above:  Chest pain in patient with multiple CV risk factors. Intermediate risk for CAD. Ecg is normal exam is normal. Needs ischemic evaluation. Discussed options including nuclear stress testing, coronary CTA, cardiac cath. Would favor coronary CTA given her body habitus. If she has evidence of obstructive disease will initiate antianginal therapy and consider cardiac cath. If she has nonobstructive CAD will need to intensify lipid lowering therapy with LDL goal <70. If CT is negative will consider an event monitor to rule out arrhythmia given her sudden weak spells.  DM type 2. Last A1c was good. On metformin HLD. Mixed. Triglycerides much better with weight loss. Await results of CT to help guide intensity of cholesterol lowering therapy HTN controlled Family history of CAD.        Medication Adjustments/Labs and Tests Ordered: Current medicines are reviewed at length with the patient today.  Concerns  regarding medicines are outlined above.  Orders Placed This Encounter  Procedures   CT CORONARY MORPH W/CTA COR W/SCORE W/CA W/CM &/OR WO/CM   Basic metabolic panel   EKG 12-Lead   Meds ordered this encounter  Medications   metoprolol tartrate (LOPRESSOR) 100 MG tablet    Sig: Take 100 mg 2 hours before Coronary CT    Dispense:  1 tablet    Refill:  0    Patient Instructions  Medication Instructions:  Continue same medications   Lab Work: Bmet   Testing/Procedures: Coronary Ct  will be scheduled after approved by insurance  Follow instructions below    Follow-Up: At [facility], you and your health needs are our priority.  As part of our continuing mission to provide you with exceptional heart care, we have created designated Provider Care Teams.  These Care Teams include your primary Cardiologist (physician) and Advanced Practice Providers (APPs -  Physician Assistants and Nurse Practitioners) who all work together to provide you with the care you need, when you need it.  We recommend signing up for the patient portal called "MyChart".  Sign up information is provided on this After Visit Summary.  MyChart is used to connect with patients for Virtual Visits (Telemedicine).  Patients are able to view lab/test results, encounter notes, upcoming appointments, etc.  Non-urgent messages can be sent to your provider as well.   To learn more about what you can do with MyChart, go to [email or web address].      Your next appointment:  After Test    The format for your next appointment: Office    Provider:  Dr.Hanifa Antonetti   Important Information About Sugar         Signed, Kejuan Bekker [place], MD  01/27/2022 4:27 PM    [facility]

## 2022-01-27 ENCOUNTER — Ambulatory Visit: Payer: BC Managed Care – PPO | Admitting: Cardiology

## 2022-01-27 ENCOUNTER — Encounter: Payer: Self-pay | Admitting: Cardiology

## 2022-01-27 VITALS — BP 128/84 | HR 95 | Ht 62.0 in | Wt 196.0 lb

## 2022-01-27 DIAGNOSIS — E119 Type 2 diabetes mellitus without complications: Secondary | ICD-10-CM

## 2022-01-27 DIAGNOSIS — I10 Essential (primary) hypertension: Secondary | ICD-10-CM | POA: Diagnosis not present

## 2022-01-27 DIAGNOSIS — Z720 Tobacco use: Secondary | ICD-10-CM

## 2022-01-27 DIAGNOSIS — R079 Chest pain, unspecified: Secondary | ICD-10-CM | POA: Diagnosis not present

## 2022-01-27 DIAGNOSIS — E782 Mixed hyperlipidemia: Secondary | ICD-10-CM

## 2022-01-27 DIAGNOSIS — R072 Precordial pain: Secondary | ICD-10-CM

## 2022-01-27 MED ORDER — METOPROLOL TARTRATE 100 MG PO TABS: ORAL_TABLET | ORAL | 0 refills | Status: DC

## 2022-01-27 NOTE — Patient Instructions (Addendum)
Medication Instructions:  Continue same medications   Lab Work: Bmet   Testing/Procedures: Coronary Ct  will be scheduled after approved by insurance  Follow instructions below    Follow-Up: At [facility], you and your health needs are our priority.  As part of our continuing mission to provide you with exceptional heart care, we have created designated Provider Care Teams.  These Care Teams include your primary Cardiologist (physician) and Advanced Practice Providers (APPs -  Physician Assistants and Nurse Practitioners) who all work together to provide you with the care you need, when you need it.  We recommend signing up for the patient portal called "MyChart".  Sign up information is provided on this After Visit Summary.  MyChart is used to connect with patients for Virtual Visits (Telemedicine).  Patients are able to view lab/test results, encounter notes, upcoming appointments, etc.  Non-urgent messages can be sent to your provider as well.   To learn more about what you can do with MyChart, go to [email or web address].      Your next appointment:  After Test    The format for your next appointment: Office    Provider:  Dr.Jordan     Your cardiac CT will be scheduled at one of the below locations:   [facility] [address] [place], [place] [address] [phone]  OR  [facility] [address] [place], [place] [address] [phone]  If scheduled at [facility], please arrive at the [facility] and Children's Entrance (Entrance C2) of [facility] 30 minutes prior to test start time. You can use the FREE valet parking offered at entrance C (encouraged to control the heart rate for the test)  Proceed to the [facility] Radiology Department (first floor) to check-in and test prep.  All radiology patients and guests should use entrance C2 at [facility], accessed from [facility], even though the hospital's physical address listed is [address].    If scheduled at [facility], please arrive 15 mins early for check-in and test prep.  Please follow these instructions carefully (unless otherwise directed):    On the Night Before the Test: Be sure to Drink plenty of water. Do not consume any caffeinated/decaffeinated beverages or chocolate 12 hours prior to your test. Do not take any antihistamines 12 hours prior to your test.   On the Day of the Test: Drink plenty of water until 1 hour prior to the test. Do not eat any food 4 hours prior to the test. You may take your regular medications prior to the test.  Take metoprolol 100 mg two hours prior to test. FEMALES- please wear underwire-free bra if available, avoid dresses & tight clothing       After the Test: Drink plenty of water. After receiving IV contrast, you may experience a mild flushed feeling. This is normal. On occasion, you may experience a mild rash up to 24 hours after the test. This is not dangerous. If this occurs, you can take Benadryl 25 mg and increase your fluid intake. If you experience trouble breathing, this can be serious. If it is severe call 911 IMMEDIATELY. If it is mild, please call our office. If you take any of these medications: Glipizide/Metformin, Avandament, Glucavance, please do not take 48 hours after completing test unless otherwise instructed.  We will call to schedule your test 2-4 weeks out understanding that some insurance companies will need  an authorization prior to the service being performed.   For non-scheduling related questions, please contact the cardiac imaging nurse navigator should you have any questions/concerns: Rockwell Alexandria, Cardiac Imaging Nurse Navigator Larey Brick, Cardiac Imaging Nurse Navigator [facility] Direct Office Dial: [phone]   For scheduling needs,  including cancellations and rescheduling, please call [place], [phone].  Important Information About Sugar

## 2022-01-28 DIAGNOSIS — E1165 Type 2 diabetes mellitus with hyperglycemia: Secondary | ICD-10-CM | POA: Diagnosis not present

## 2022-02-01 DIAGNOSIS — I10 Essential (primary) hypertension: Secondary | ICD-10-CM | POA: Diagnosis not present

## 2022-02-01 DIAGNOSIS — E782 Mixed hyperlipidemia: Secondary | ICD-10-CM | POA: Diagnosis not present

## 2022-02-01 DIAGNOSIS — R079 Chest pain, unspecified: Secondary | ICD-10-CM | POA: Diagnosis not present

## 2022-02-01 DIAGNOSIS — E119 Type 2 diabetes mellitus without complications: Secondary | ICD-10-CM | POA: Diagnosis not present

## 2022-02-01 LAB — BASIC METABOLIC PANEL
BUN/Creatinine Ratio: 12 (ref 9–23)
BUN: 9 mg/dL (ref 6–24)
CO2: 26 mmol/L (ref 20–29)
Calcium: 9.5 mg/dL (ref 8.7–10.2)
Chloride: 100 mmol/L (ref 96–106)
Creatinine, Ser: 0.75 mg/dL (ref 0.57–1.00)
Glucose: 99 mg/dL (ref 70–99)
Potassium: 5.3 mmol/L — ABNORMAL HIGH (ref 3.5–5.2)
Sodium: 138 mmol/L (ref 134–144)
eGFR: 95 mL/min/{1.73_m2} (ref >59)

## 2022-02-17 ENCOUNTER — Telehealth (HOSPITAL_COMMUNITY): Payer: Self-pay | Admitting: Emergency Medicine

## 2022-02-17 ENCOUNTER — Other Ambulatory Visit: Payer: Self-pay | Admitting: Cardiology

## 2022-02-17 DIAGNOSIS — E119 Type 2 diabetes mellitus without complications: Secondary | ICD-10-CM

## 2022-02-17 DIAGNOSIS — I10 Essential (primary) hypertension: Secondary | ICD-10-CM

## 2022-02-17 DIAGNOSIS — R079 Chest pain, unspecified: Secondary | ICD-10-CM

## 2022-02-17 DIAGNOSIS — R072 Precordial pain: Secondary | ICD-10-CM

## 2022-02-17 NOTE — Telephone Encounter (Signed)
Pt received email with her financial responsibility for this test (CCTA) and states she cannot afford it.   She is requesting to talk to MD about other cheaper imaging options.  CCTA cancelled for now.   Rockwell Alexandria RN Navigator Cardiac Imaging [facility] [phone] Office  [phone] Cell

## 2022-02-17 NOTE — Telephone Encounter (Signed)
Spoke to patient Dr.Jordan advised to schedule stress myoview.Scheduler will call back with appointment. Message sent to Dr.Jordan to sign attestation order.

## 2022-02-17 NOTE — Telephone Encounter (Signed)
Spoke to patient stress myoview scheduled 9/12 arrive at 7:45 am at Dr.Jordan's office.Advised no food,no caffeine.Advised ok to take morning meds with water.

## 2022-02-18 ENCOUNTER — Ambulatory Visit (HOSPITAL_COMMUNITY): Admission: RE | Admit: 2022-02-18 | Payer: BC Managed Care – PPO | Source: Ambulatory Visit

## 2022-02-18 ENCOUNTER — Telehealth (HOSPITAL_COMMUNITY): Payer: Self-pay | Admitting: *Deleted

## 2022-02-18 NOTE — Telephone Encounter (Signed)
Message sent to Dr.Jordan to sign attestation order.

## 2022-02-18 NOTE — Telephone Encounter (Signed)
Close encounter 

## 2022-02-19 ENCOUNTER — Other Ambulatory Visit: Payer: Self-pay | Admitting: Cardiology

## 2022-02-19 DIAGNOSIS — R079 Chest pain, unspecified: Secondary | ICD-10-CM

## 2022-02-22 ENCOUNTER — Ambulatory Visit (HOSPITAL_COMMUNITY)
Admission: RE | Admit: 2022-02-22 | Payer: BC Managed Care – PPO | Source: Ambulatory Visit | Attending: Cardiology | Admitting: Cardiology

## 2022-02-24 ENCOUNTER — Encounter (HOSPITAL_COMMUNITY): Payer: Self-pay | Admitting: Cardiology

## 2022-03-04 NOTE — Progress Notes (Deleted)
Cardiology Office Note:    Date:  03/04/2022   ID:  [name], DOB 06-May-1966, MRN [id number]  PCP:  Merryl Hacker, No   [facility] Providers Cardiologist:  None     Referring MD: No ref. provider found   No chief complaint on file.   History of Present Illness:    [name] is a 56 y.o. female who is seen at the request of Windy Carina FNP for evaluation of palpitations and left sided chest pain. She has a history of HTN, DM type 2 and HLD. Also history of tobacco abuse and family history of CAD.  She complains of a 2 month history of chest pain. It is located under her left breast and radiates into her neck and back. No known triggers including activity. In addition she describes episodes of "stupid tired" where she will suddenly feel weak and exhausted and she has to lie down. She feels it washing over her from her shoulders down. She does note some skipping at times. Rare SOB. She has lost 40 lbs recently intentionally. No prior cardiac evaluation other than Ecgs.   Past Medical History:  Diagnosis Date   Diabetes mellitus without complication ([place])    Hyperlipidemia    Hypertension    Migraine    Pneumonia     Past Surgical History:  Procedure Laterality Date   ABLATION     TUBAL LIGATION      Current Medications: No outpatient medications have been marked as taking for the 03/07/22 encounter (Appointment) with [place], Lilliona Blakeney M, MD.     Allergies:   Klonopin [clonazepam] and Shellfish allergy   Social History   Socioeconomic History   Marital status: Divorced    Spouse name: Not on file   Number of children: 3   Years of education: Not on file   Highest education level: Not on file  Occupational History   Not on file  Tobacco Use   Smoking status: Every Day    Packs/day: 1.00    Types: Cigarettes   Smokeless tobacco: Not on file  Vaping Use   Vaping Use: Every day  Substance and Sexual Activity   Alcohol use: Yes    Comment: socially    Drug use: No   Sexual activity: Not on file  Other Topics Concern   Not on file  Social History 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  ROS:   Please see the history of present illness.     All other systems reviewed and are negative.  EKGs/Labs/Other Studies Reviewed:    The following studies were reviewed today:   EKG:  EKG is  ordered today.  The ekg ordered today demonstrates NSR rate 95. Normal. I have personally reviewed and interpreted this study.    Recent Labs: 01/03/2022: Hemoglobin 14.7; Magnesium 1.9; Platelets 377; TSH 1.704 02/01/2022: BUN 9; Creatinine, Ser 0.75; Potassium 5.3; Sodium 138  Recent Lipid Panel No results found for: "CHOL", "TRIG", "HDL", "CHOLHDL", "VLDL", "LDLCALC", "LDLDIRECT" Dated 06/29/21: normal LFTs.  Dated 01/07/22: A1c 5.9%.  cholesterol 210, triglycerides 158, HDL 38, LDL 143.   Risk Assessment/Calculations:           Physical Exam:    VS:  There were no vitals taken for this visit.    Wt Readings from Last 3 Encounters:  01/27/22 196 lb (88.9 kg)  04/01/13 147 lb (66.7 kg)  12/19/11 185 lb (83.9 kg)     GEN:  Well nourished, well developed in no acute distress HEENT: Normal NECK: No JVD; No carotid bruits LYMPHATICS: No lymphadenopathy CARDIAC: RRR, no murmurs, rubs, gallops RESPIRATORY:  Clear to auscultation without rales, wheezing or rhonchi  ABDOMEN: Soft, non-tender, non-distended MUSCULOSKELETAL:  No edema; No deformity  SKIN: Warm and dry NEUROLOGIC:  Alert and oriented x  3 PSYCHIATRIC:  Normal affect   ASSESSMENT:    No diagnosis found.  PLAN:    In order of problems listed above:  Chest pain in patient with multiple CV risk factors. Intermediate risk for CAD. Ecg is normal exam is normal. Needs ischemic evaluation. Discussed options including nuclear stress testing, coronary CTA, cardiac cath. Would favor coronary CTA given her body habitus. If she has evidence of obstructive disease will initiate antianginal therapy and consider cardiac cath. If she has nonobstructive CAD will need to intensify lipid lowering therapy with LDL goal <70. If CT is negative will consider an event monitor to rule out arrhythmia given her sudden weak spells.  DM type 2. Last A1c was good. On metformin HLD. Mixed. Triglycerides much better with weight loss. Await results of CT to help guide intensity of cholesterol lowering therapy HTN controlled Family history of CAD.        Medication Adjustments/Labs and Tests Ordered: Current medicines are reviewed at length with the patient today.  Concerns regarding medicines are outlined above.  No orders of the defined types were placed in this encounter.  No orders of the defined types were placed in this encounter.   There are no Patient Instructions on file for this visit.   Signed, Orhan Mayorga [place], MD  03/04/2022 8:07 AM    [facility]

## 2022-03-07 ENCOUNTER — Ambulatory Visit: Payer: BC Managed Care – PPO | Attending: Cardiology | Admitting: Cardiology

## 2022-03-25 ENCOUNTER — Emergency Department (HOSPITAL_BASED_OUTPATIENT_CLINIC_OR_DEPARTMENT_OTHER): Payer: BC Managed Care – PPO

## 2022-03-25 ENCOUNTER — Emergency Department (HOSPITAL_BASED_OUTPATIENT_CLINIC_OR_DEPARTMENT_OTHER)
Admission: EM | Admit: 2022-03-25 | Discharge: 2022-03-25 | Disposition: A | Discharge status: Home / Self Care | Payer: BC Managed Care – PPO | Attending: Emergency Medicine | Admitting: Emergency Medicine

## 2022-03-25 ENCOUNTER — Encounter (HOSPITAL_BASED_OUTPATIENT_CLINIC_OR_DEPARTMENT_OTHER): Payer: Self-pay | Admitting: *Deleted

## 2022-03-25 ENCOUNTER — Other Ambulatory Visit: Payer: Self-pay

## 2022-03-25 DIAGNOSIS — R110 Nausea: Secondary | ICD-10-CM | POA: Diagnosis not present

## 2022-03-25 DIAGNOSIS — K920 Hematemesis: Secondary | ICD-10-CM | POA: Insufficient documentation

## 2022-03-25 DIAGNOSIS — R112 Nausea with vomiting, unspecified: Secondary | ICD-10-CM | POA: Diagnosis not present

## 2022-03-25 DIAGNOSIS — R1110 Vomiting, unspecified: Secondary | ICD-10-CM | POA: Diagnosis not present

## 2022-03-25 DIAGNOSIS — R0602 Shortness of breath: Secondary | ICD-10-CM | POA: Diagnosis not present

## 2022-03-25 LAB — URINALYSIS, ROUTINE W REFLEX MICROSCOPIC
Bilirubin Urine: NEGATIVE
Glucose, UA: NEGATIVE mg/dL
Hgb urine dipstick: NEGATIVE
Ketones, ur: NEGATIVE mg/dL
Leukocytes,Ua: NEGATIVE
Nitrite: NEGATIVE
Protein, ur: NEGATIVE mg/dL
Specific Gravity, Urine: 1.007 (ref 1.005–1.030)
pH: 7 (ref 5.0–8.0)

## 2022-03-25 LAB — COMPREHENSIVE METABOLIC PANEL
ALT: 27 U/L (ref 0–44)
AST: 17 U/L (ref 15–41)
Albumin: 4.6 g/dL (ref 3.5–5.0)
Alkaline Phosphatase: 56 U/L (ref 38–126)
Anion gap: 12 (ref 5–15)
BUN: 8 mg/dL (ref 6–20)
CO2: 26 mmol/L (ref 22–32)
Calcium: 9.4 mg/dL (ref 8.9–10.3)
Chloride: 102 mmol/L (ref 98–111)
Creatinine, Ser: 0.67 mg/dL (ref 0.44–1.00)
GFR, Estimated: >60 mL/min (ref >60)
Glucose, Bld: 115 mg/dL — ABNORMAL HIGH (ref 70–99)
Potassium: 4.1 mmol/L (ref 3.5–5.1)
Sodium: 140 mmol/L (ref 135–145)
Total Bilirubin: 0.4 mg/dL (ref 0.3–1.2)
Total Protein: 7.8 g/dL (ref 6.5–8.1)

## 2022-03-25 LAB — CBC
HCT: 40.6 % (ref 36.0–46.0)
Hemoglobin: 14 g/dL (ref 12.0–15.0)
MCH: 30.6 pg (ref 26.0–34.0)
MCHC: 34.5 g/dL (ref 30.0–36.0)
MCV: 88.6 fL (ref 80.0–100.0)
Platelets: 372 10*3/uL (ref 150–400)
RBC: 4.58 MIL/uL (ref 3.87–5.11)
RDW: 12.3 % (ref 11.5–15.5)
WBC: 9 10*3/uL (ref 4.0–10.5)
nRBC: 0 % (ref 0.0–0.2)

## 2022-03-25 LAB — LIPASE, BLOOD: Lipase: 27 U/L (ref 11–51)

## 2022-03-25 LAB — PREGNANCY, URINE: Preg Test, Ur: NEGATIVE

## 2022-03-25 NOTE — ED Provider Notes (Signed)
[facility] EMERGENCY DEPT Provider Note   CSN: [id number] Arrival date & time: 03/25/22  [id number]     History Chief Complaint  Patient presents with   Hematemesis    HPI [name] is a 56 y.o. female presenting for an episode of hematemesis.  She states she has a history of esophagitis and has recently been started on Ozempic.  She had to be taken off Ozempic recently because of intractable nausea and vomiting.  This morning she had recurrence of her nausea with severe feelings of vomiting.  She denies fevers or chills, nausea vomiting at this time and otherwise has had complete resolution of her symptoms.  She states there was bright red blood in her 1 episode of emesis.  She states that she like to be discharged at this time.    Patient's recorded medical, surgical, social, medication list and allergies were reviewed in the Snapshot window as part of the initial history.   Review of Systems   Review of Systems  Constitutional:  Negative for chills and fever.  HENT:  Negative for ear pain and sore throat.   Eyes:  Negative for pain and visual disturbance.  Respiratory:  Negative for cough and shortness of breath.   Cardiovascular:  Negative for chest pain and palpitations.  Gastrointestinal:  Negative for abdominal pain and vomiting.  Genitourinary:  Negative for dysuria and hematuria.  Musculoskeletal:  Negative for arthralgias and back pain.  Skin:  Negative for color change and rash.  Neurological:  Negative for seizures and syncope.  All other systems reviewed and are negative.   Physical Exam Updated Vital Signs BP (!) 141/80 (BP Location: Right Arm)   Pulse 78   Temp 98 F (36.7 C) (Oral)   Resp 16   SpO2 97%  Physical Exam   ED Course/ Medical Decision Making/ A&P    Procedures Procedures   Medications Ordered in ED Medications - No data to display  Medical Decision Making:    [name] is a 56 y.o. female who presented to  the ED today with vomiting detailed above.     Additional history discussed with patient's family/caregivers.  Patient placed on continuous vitals and telemetry monitoring while in ED which was reviewed periodically.   Complete initial physical exam performed, notably the patient  was HDS in NAD.      Reviewed and confirmed nursing documentation for past medical history, family history, social history.    Initial Assessment:   With the patient's presentation of vomiting with hematemesis, most likely diagnosis is Mallory-Weiss tears. Other diagnoses were considered including (but not limited to) Boerhaave syndrome, pancreatitis, esophageal varices. These are considered less likely due to history of present illness and physical exam findings.   This is most consistent with an acute life/limb threatening illness complicated by underlying chronic conditions.  Initial Plan:  Screening labs including CBC and Metabolic panel to evaluate for infectious or metabolic etiology of disease.  Urinalysis with reflex culture ordered to evaluate for UTI or relevant urologic/nephrologic pathology. Lipase to evaluate for pancreatitis CXR to evaluate for structural/infectious intrathoracic pathology.  Objective evaluation as below reviewed with plan for close reassessment  Initial Study Results:   Laboratory  All laboratory results reviewed without evidence of clinically relevant pathology.    Radiology  All images reviewed independently. Agree with radiology report at this time.   DG Chest 2 View  Result Date: 03/25/2022 CLINICAL DATA:  56 year old female with abrupt nausea vomiting. Shortness of breath.  EXAM: CHEST - 2 VIEW COMPARISON:  Chest radiograph 01/03/2022 and earlier. FINDINGS: Normal lung volumes and mediastinal contours. Visualized tracheal air column is within normal limits. Both lungs appear clear. No pneumothorax or pleural effusion. Visible bowel gas pattern is within normal limits. No  pneumoperitoneum. No acute osseous abnormality identified. IMPRESSION: Negative.  No cardiopulmonary abnormality. Electronically Signed   By: Genevie Ann M.D.   On: 03/25/2022 11:50      Final Assessment and Plan:   On repeat evaluation after 4 hours in the emergency department, patient remained hemodynamically stable in no acute distress.  She is ambulatory tolerating p.o. intake.  Uncertain underlying etiology of nausea possibly medication related given her history of similar.  Recommend patient follow-up with primary care provider within 48 hours for ongoing care management.  Patient discharged in no acute distress.    Disposition:  Based on the above findings, I believe patient is stable for discharge.    Patient/family educated about specific return precautions for given chief complaint and symptoms.  Patient/family educated about follow-up with PCP.     Patient/family expressed understanding of return precautions and need for follow-up. Patient spoken to regarding all imaging and laboratory results and appropriate follow up for these results. All education provided in verbal form with additional information in written form. Time was allowed for answering of patient questions. Patient discharged.    Emergency Department Medication Summary:   Medications - No data to display      Clinical Impression:  1. Vomiting, unspecified vomiting type, unspecified whether nausea present      Discharge   Final Clinical Impression(s) / ED Diagnoses Final diagnoses:  Vomiting, unspecified vomiting type, unspecified whether nausea present    Rx / DC Orders ED Discharge Orders     None         Tretha Sciara, MD 03/25/22 1314

## 2022-03-25 NOTE — ED Notes (Signed)
Patient transported to X-ray 

## 2022-03-25 NOTE — ED Notes (Signed)
Pt left without being given discharge instructions. Pt was not in the room when RN arrived to discharge. Pt and husband was last seen walking out toward the records desk. Pt when last seen by this RN was alert oriented , pain free and ambulatory.

## 2022-03-25 NOTE — ED Triage Notes (Addendum)
Pt reports that she drove to work and was feeling tired and then suddenly nauseated and she had to vomit a large amount forcefully.  Pt describes the emesis as undigested food and blood/blood streaked.  No abdominal pain with this.  Pt describes a feeling of intense thirst and states that earlier in the week she had some cramping LLQ pain.

## 2022-03-29 DIAGNOSIS — G4700 Insomnia, unspecified: Secondary | ICD-10-CM | POA: Diagnosis not present

## 2022-03-29 DIAGNOSIS — K920 Hematemesis: Secondary | ICD-10-CM | POA: Diagnosis not present

## 2022-04-26 DIAGNOSIS — G4700 Insomnia, unspecified: Secondary | ICD-10-CM | POA: Diagnosis not present

## 2022-04-26 DIAGNOSIS — E1165 Type 2 diabetes mellitus with hyperglycemia: Secondary | ICD-10-CM | POA: Diagnosis not present

## 2022-04-26 DIAGNOSIS — G43909 Migraine, unspecified, not intractable, without status migrainosus: Secondary | ICD-10-CM | POA: Diagnosis not present

## 2022-05-18 DIAGNOSIS — U071 COVID-19: Secondary | ICD-10-CM | POA: Diagnosis not present
